# Patient Record
Sex: Female | Born: 1990 | Race: Black or African American | Hispanic: No | Marital: Single | Smoking: Never smoker
Health system: Southern US, Community
[De-identification: ages and names within clinical notes are randomized; demographics above are authoritative.]

## PROBLEM LIST (undated history)

## (undated) DIAGNOSIS — M549 Dorsalgia, unspecified: Secondary | ICD-10-CM

## (undated) DIAGNOSIS — F419 Anxiety disorder, unspecified: Secondary | ICD-10-CM

## (undated) DIAGNOSIS — D649 Anemia, unspecified: Secondary | ICD-10-CM

## (undated) DIAGNOSIS — F32A Depression, unspecified: Secondary | ICD-10-CM

## (undated) DIAGNOSIS — F329 Major depressive disorder, single episode, unspecified: Secondary | ICD-10-CM

## (undated) HISTORY — DX: Anxiety disorder, unspecified: F41.9

## (undated) HISTORY — PX: TONSILLECTOMY: SUR1361

## (undated) HISTORY — DX: Anemia, unspecified: D64.9

## (undated) HISTORY — DX: Depression, unspecified: F32.A

---
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
## 2015-11-07 ENCOUNTER — Encounter (HOSPITAL_COMMUNITY): Payer: Self-pay

## 2015-11-07 ENCOUNTER — Emergency Department (HOSPITAL_COMMUNITY)
Admission: EM | Admit: 2015-11-07 | Discharge: 2015-11-07 | Disposition: A | Discharge status: Home / Self Care | Payer: Self-pay | Attending: Emergency Medicine | Admitting: Emergency Medicine

## 2015-11-07 DIAGNOSIS — Z79891 Long term (current) use of opiate analgesic: Secondary | ICD-10-CM | POA: Insufficient documentation

## 2015-11-07 DIAGNOSIS — R102 Pelvic and perineal pain: Secondary | ICD-10-CM | POA: Insufficient documentation

## 2015-11-07 LAB — COMPREHENSIVE METABOLIC PANEL
ALT: 13 U/L — AB (ref 14–54)
AST: 17 U/L (ref 15–41)
Albumin: 3.7 g/dL (ref 3.5–5.0)
Alkaline Phosphatase: 70 U/L (ref 38–126)
Anion gap: 6 (ref 5–15)
BILIRUBIN TOTAL: 0.5 mg/dL (ref 0.3–1.2)
BUN: 15 mg/dL (ref 6–20)
CALCIUM: 9.6 mg/dL (ref 8.9–10.3)
CHLORIDE: 107 mmol/L (ref 101–111)
CO2: 23 mmol/L (ref 22–32)
CREATININE: 1.24 mg/dL — AB (ref 0.44–1.00)
GFR, EST NON AFRICAN AMERICAN: 60 mL/min — AB (ref >60)
Glucose, Bld: 98 mg/dL (ref 65–99)
Potassium: 3.6 mmol/L (ref 3.5–5.1)
Sodium: 136 mmol/L (ref 135–145)
TOTAL PROTEIN: 6.9 g/dL (ref 6.5–8.1)

## 2015-11-07 LAB — WET PREP, GENITAL
SPERM: NONE SEEN
TRICH WET PREP: NONE SEEN
Yeast Wet Prep HPF POC: NONE SEEN

## 2015-11-07 LAB — URINE MICROSCOPIC-ADD ON: WBC UA: NONE SEEN WBC/hpf (ref 0–5)

## 2015-11-07 LAB — URINALYSIS, ROUTINE W REFLEX MICROSCOPIC
Bilirubin Urine: NEGATIVE
Glucose, UA: NEGATIVE mg/dL
KETONES UR: NEGATIVE mg/dL
LEUKOCYTES UA: NEGATIVE
NITRITE: NEGATIVE
PROTEIN: NEGATIVE mg/dL
Specific Gravity, Urine: 1.023 (ref 1.005–1.030)
pH: 5.5 (ref 5.0–8.0)

## 2015-11-07 LAB — CBC
HCT: 40.8 % (ref 36.0–46.0)
Hemoglobin: 13 g/dL (ref 12.0–15.0)
MCH: 27.7 pg (ref 26.0–34.0)
MCHC: 31.9 g/dL (ref 30.0–36.0)
MCV: 86.8 fL (ref 78.0–100.0)
PLATELETS: 355 10*3/uL (ref 150–400)
RBC: 4.7 MIL/uL (ref 3.87–5.11)
RDW: 12.5 % (ref 11.5–15.5)
WBC: 7.4 10*3/uL (ref 4.0–10.5)

## 2015-11-07 LAB — LIPASE, BLOOD: LIPASE: 31 U/L (ref 11–51)

## 2015-11-07 LAB — PREGNANCY, URINE: PREG TEST UR: NEGATIVE

## 2015-11-07 MED ORDER — OXYCODONE-ACETAMINOPHEN 5-325 MG PO TABS: 1.0000 | ORAL_TABLET | Freq: Four times a day (QID) | ORAL | Status: DC | PRN

## 2015-11-07 MED ORDER — KETOROLAC TROMETHAMINE 60 MG/2ML IM SOLN
60.0000 mg | Freq: Once | INTRAMUSCULAR | Status: AC
  Administered 2015-11-07: 60 mg via INTRAMUSCULAR
  Filled 2015-11-07: qty 2

## 2015-11-07 NOTE — ED Notes (Signed)
Pt states she is experiencing particularly painful menstrual cramps. Has been on period x 2 weeks. Pt states pain throughout back and lower abdomen. Pt states recently had abscess lanced and placed on abx. Since abx, cramps increased.

## 2015-11-07 NOTE — ED Notes (Signed)
Patient states she has been on her period for 2 weeks.  States her abd cramping got worse when she started the Bactrim for an abscess that has been lanced. States she is not just spotting no heavy bleeding at this time.

## 2015-11-07 NOTE — ED Provider Notes (Signed)
CSN: [id number]     Arrival date & time 11/07/15  0207 History   First MD Initiated Contact with Patient 11/07/15 0402     Chief Complaint  Patient presents with  . Abdominal Cramping     (Consider location/radiation/quality/duration/timing/severity/associated sxs/prior Treatment) Patient is a 25 y.o. female presenting with cramps. The history is provided by the patient.  Abdominal Cramping This is a recurrent problem. Episode onset: 2 weeks ago. The problem occurs constantly. The problem has not changed since onset.Associated symptoms include abdominal pain. Associated symptoms comments: Lower abdominal cramping that radiates to her lower back. Intermittent vaginal spotting over the last 2 weeks. No vaginal discharge. Last sexual contact was 4 months ago but not consistently using protection. No fevers but occasional vomiting related to pain. No diarrhea.. The symptoms are aggravated by walking (Standing). Nothing relieves the symptoms. Treatments tried: Ibuprofen and some leftover Vicodin. The treatment provided no relief.    History reviewed. No pertinent past medical history. History reviewed. No pertinent past surgical history. History reviewed. No pertinent family history. Social History  Substance Use Topics  . Smoking status: Never Smoker   . Smokeless tobacco: None  . Alcohol Use: Yes   OB History    No data available     Review of Systems  Gastrointestinal: Positive for abdominal pain.  All other systems reviewed and are negative.     Allergies  Review of patient's allergies indicates not on file.  Home Medications   Prior to Admission medications   Medication Sig Start Date End Date Taking? Authorizing Provider  oxyCODONE-acetaminophen (PERCOCET/ROXICET) 5-325 MG tablet Take 1-2 tablets by mouth every 6 (six) hours as needed for severe pain. 11/07/15   Gwyneth SproutWhitney Stuti Sandin, MD   BP 130/92 mmHg  Pulse 84  Temp(Src) 98.3 F (36.8 C) (Oral)  Resp 18  SpO2 100%   LMP 11/07/2015 Physical Exam  Constitutional: She is oriented to person, place, and time. She appears well-developed and well-nourished. No distress.  HENT:  Head: Normocephalic and atraumatic.  Mouth/Throat: Oropharynx is clear and moist.  Eyes: Conjunctivae and EOM are normal. Pupils are equal, round, and reactive to light.  Neck: Normal range of motion. Neck supple.  Cardiovascular: Normal rate, regular rhythm and intact distal pulses.   No murmur heard. Pulmonary/Chest: Effort normal and breath sounds normal. No respiratory distress. She has no wheezes. She has no rales.  Abdominal: Soft. She exhibits no distension. There is no tenderness. There is no rebound, no guarding and no CVA tenderness.  Musculoskeletal: Normal range of motion. She exhibits no edema.       Lumbar back: She exhibits tenderness and pain. She exhibits no bony tenderness.       Back:  Neurological: She is alert and oriented to person, place, and time.  Skin: Skin is warm and dry. No rash noted. No erythema.  Psychiatric: She has a normal mood and affect. Her behavior is normal.  Nursing note and vitals reviewed.   ED Course  Procedures (including critical care time) Labs Review Labs Reviewed  WET PREP, GENITAL - Abnormal; Notable for the following:    Clue Cells Wet Prep HPF POC PRESENT (*)    WBC, Wet Prep HPF POC FEW (*)    All other components within normal limits  COMPREHENSIVE METABOLIC PANEL - Abnormal; Notable for the following:    Creatinine, Ser 1.24 (*)    ALT 13 (*)    GFR calc non Af Amer 60 (*)    All other  components within normal limits  URINALYSIS, ROUTINE W REFLEX MICROSCOPIC (NOT AT [facility]) - Abnormal; Notable for the following:    Hgb urine dipstick LARGE (*)    All other components within normal limits  URINE MICROSCOPIC-ADD ON - Abnormal; Notable for the following:    Squamous Epithelial / LPF 0-5 (*)    Bacteria, UA RARE (*)    All other components within normal limits  LIPASE,  BLOOD  CBC  PREGNANCY, URINE  GC/CHLAMYDIA PROBE AMP ([place]) NOT AT [facility]    Imaging Review No results found. I have personally reviewed and evaluated these images and lab results as part of my medical decision-making.   EKG Interpretation None      MDM   Final diagnoses:  Pelvic pain in female    Patient is a 25 year old female presenting today with pelvic pain that's been going on for 2 weeks which she describes as severe abdominal cramping with only minimal vaginal bleeding. Patient has a long history of metromenorrhagia and has been on birth control from a young age. She currently has Nexplanon which is due to be changed in November. She states that she has been taking ibuprofen as well as some leftover Vicodin which is most likely outdated without improvement in her pain. She denies any urinary symptoms but pain does radiate to her lower back. She has no new vaginal discharge and last sexual contact was 4 months ago. She will have occasional vomiting but denies any fevers.  On pelvic exam patient has mild tenderness over bilateral adnexa but no cervical motion tenderness or uterine tenderness. There is some blood in the vaginal vault but no significant discharge. Palpable lumbar tenderness but no suprapubic tenderness.  Pelvic cultures done however low suspicion for STD at this time.  Wet prep with clue cells but no other significant findings. UA with hemoglobin but otherwise normal. CBC, CMP and lipase all without acute findings. Low suspicion the patient's symptoms are caused by kidney stone, UTI, appendicitis. Feel most likely that this is pelvic in nature. She was given pain medication and referred to gynecology.   Gwyneth Sprout, MD 11/07/15 [phone]

## 2015-11-07 NOTE — ED Notes (Signed)
Discharge instructions reviewed - voiced understanding 

## 2015-11-07 NOTE — ED Notes (Signed)
Pelvic cart set up in room 

## 2015-11-09 LAB — GC/CHLAMYDIA PROBE AMP (~~LOC~~) NOT AT ARMC
Chlamydia: NEGATIVE
NEISSERIA GONORRHEA: NEGATIVE

## 2016-07-16 ENCOUNTER — Encounter (HOSPITAL_COMMUNITY): Payer: Self-pay | Admitting: Emergency Medicine

## 2016-07-16 ENCOUNTER — Emergency Department (HOSPITAL_COMMUNITY)
Admission: EM | Admit: 2016-07-16 | Discharge: 2016-07-16 | Disposition: A | Discharge status: Home / Self Care | Payer: Self-pay | Attending: Emergency Medicine | Admitting: Emergency Medicine

## 2016-07-16 DIAGNOSIS — M545 Low back pain: Secondary | ICD-10-CM | POA: Insufficient documentation

## 2016-07-16 DIAGNOSIS — G8929 Other chronic pain: Secondary | ICD-10-CM | POA: Insufficient documentation

## 2016-07-16 DIAGNOSIS — Y999 Unspecified external cause status: Secondary | ICD-10-CM | POA: Insufficient documentation

## 2016-07-16 DIAGNOSIS — X500XXA Overexertion from strenuous movement or load, initial encounter: Secondary | ICD-10-CM | POA: Insufficient documentation

## 2016-07-16 DIAGNOSIS — Y939 Activity, unspecified: Secondary | ICD-10-CM | POA: Insufficient documentation

## 2016-07-16 DIAGNOSIS — Y92410 Unspecified street and highway as the place of occurrence of the external cause: Secondary | ICD-10-CM | POA: Insufficient documentation

## 2016-07-16 HISTORY — DX: Dorsalgia, unspecified: M54.9

## 2016-07-16 LAB — URINALYSIS, ROUTINE W REFLEX MICROSCOPIC
Bilirubin Urine: NEGATIVE
GLUCOSE, UA: NEGATIVE mg/dL
Hgb urine dipstick: NEGATIVE
Ketones, ur: NEGATIVE mg/dL
LEUKOCYTES UA: NEGATIVE
NITRITE: NEGATIVE
Protein, ur: NEGATIVE mg/dL
Specific Gravity, Urine: 1.025 (ref 1.005–1.030)
pH: 5 (ref 5.0–8.0)

## 2016-07-16 MED ORDER — METHOCARBAMOL 500 MG PO TABS
1000.0000 mg | ORAL_TABLET | Freq: Once | ORAL | Status: AC
  Administered 2016-07-16: 1000 mg via ORAL
  Filled 2016-07-16: qty 2

## 2016-07-16 MED ORDER — CYCLOBENZAPRINE HCL 10 MG PO TABS: 10.0000 mg | ORAL_TABLET | Freq: Two times a day (BID) | ORAL | 0 refills | Status: DC | PRN

## 2016-07-16 MED ORDER — HYDROCODONE-ACETAMINOPHEN 5-325 MG PO TABS: 1.0000 | ORAL_TABLET | Freq: Four times a day (QID) | ORAL | 0 refills | Status: DC | PRN

## 2016-07-16 MED ORDER — IBUPROFEN 200 MG PO TABS
600.0000 mg | ORAL_TABLET | Freq: Once | ORAL | Status: AC
  Administered 2016-07-16: 600 mg via ORAL
  Filled 2016-07-16: qty 1

## 2016-07-16 MED ORDER — HYDROCODONE-ACETAMINOPHEN 5-325 MG PO TABS
1.0000 | ORAL_TABLET | Freq: Once | ORAL | Status: AC
  Administered 2016-07-16: 1 via ORAL
  Filled 2016-07-16: qty 1

## 2016-07-16 MED ORDER — IBUPROFEN 600 MG PO TABS: 600.0000 mg | ORAL_TABLET | Freq: Four times a day (QID) | ORAL | 0 refills | Status: DC | PRN

## 2016-07-16 MED ORDER — ONDANSETRON 4 MG PO TBDP
4.0000 mg | ORAL_TABLET | Freq: Once | ORAL | Status: AC
  Administered 2016-07-16: 4 mg via ORAL
  Filled 2016-07-16: qty 1

## 2016-07-16 NOTE — ED Provider Notes (Signed)
[facility]-EMERGENCY DEPT Provider Note   CSN: [id number] Arrival date & time: 07/16/16  0108     History   Chief Complaint Chief Complaint  Patient presents with  . Back Pain    HPI Alyssa KatoQuanshe Bachmann is a 26 y.o. female who presents with back pain. PMH significant for chronic back pain. 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 She has had an extensive work up and previously was receiving trigger point injections which helped. 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 She is originally from [place] and moved here a year ago. She has been uninsured and therefore unable to follow up with a specialist since then. She states 4 days ago she started to notice gradual onset of worsening acute on chronic back pain. The pain is across her lower back and goes in to her bilateral buttocks and thighs down to the knee. Her pain is usually a 4-5 and today is 8-9. She denies No fever, syncope, trauma, unexplained weight loss, hx of cancer, loss of bowel/bladder function, saddle anesthesia, urinary retention, IVDU, dysuria.. She states she has had difficulty ambulating due to pain and has had some leg weakness if she stands for too long.   HPI  Past Medical History:  Diagnosis Date  . Back pain     There are no active problems to display for this patient.   History reviewed. No pertinent surgical history.  OB History    No data available       Home Medications    Prior to Admission medications   Medication Sig Start Date End Date Taking? Authorizing Provider  oxyCODONE-acetaminophen (PERCOCET/ROXICET) 5-325 MG tablet Take 1-2 tablets by mouth every 6 (six) hours as needed for severe pain. 11/07/15   Gwyneth SproutWhitney Plunkett, MD    Family History No family history on file.  Social History Social History  Substance Use Topics  . Smoking status:  Never Smoker  . Smokeless tobacco: Never Used  . Alcohol use Yes     Allergies   Patient has no allergy information on record.   Review of Systems Review of Systems  Constitutional: Negative for chills and fever.  Genitourinary: Negative for dysuria.  Musculoskeletal: Positive for arthralgias, back pain, gait problem and myalgias.  Neurological: Positive for weakness. Negative for numbness.     Physical Exam Updated Vital Signs BP 109/74   Pulse 90   Temp 98.6 F (37 C) (Oral)   Resp 19   Ht 5\' 6"  (1.676 m)   Wt 116.1 kg   SpO2 100%   BMI 41.32 kg/m   Physical Exam  Constitutional: She is oriented to person, place, and time. She appears well-developed and well-nourished. No distress.  HENT:  Head: Normocephalic and atraumatic.  Eyes: Conjunctivae are normal. Pupils are equal, round, and reactive to light. Right eye exhibits no discharge. Left eye exhibits no discharge. No scleral icterus.  Neck: Normal range of motion.  Cardiovascular: Normal rate.   Pulmonary/Chest: Effort normal. No respiratory distress.  Abdominal: She exhibits no distension.  Musculoskeletal:  Inspection: No masses, deformity, or rash Palpation: Diffuse low back tenderness and bilateral SI joint tenderness Strength: 4/5 in lower extremities bilaterally and normal plantar and dorsiflexion Sensation: Intact sensation with light touch in lower extremities bilaterally Reflexes: Patellar reflex is 2+ bilaterally SLR: Negative seated straight leg raise Gait:  Antalgic gait  Neurological: She is alert and oriented to person, place, and time.  Skin: Skin is warm and dry.  Psychiatric: She has a normal mood and affect. Her behavior is normal.  Nursing note and vitals reviewed.    ED Treatments / Results  Labs (all labs ordered are listed, but only abnormal results are displayed) Labs Reviewed  URINALYSIS, ROUTINE W REFLEX MICROSCOPIC - Abnormal; Notable for the following:       Result Value    APPearance HAZY (*)    All other components within normal limits  POC URINE PREG, ED    EKG  EKG Interpretation None       Radiology No results found.  Procedures Procedures (including critical care time)  Medications Ordered in ED Medications - No data to display   Initial Impression / Assessment and Plan / ED Course  I have reviewed the triage vital signs and the nursing notes.  Pertinent labs & imaging results that were available during my care of the patient were reviewed by me and considered in my medical decision making (see chart for details).  26 year old female presents with acute on chronic back pain. She has diffuse pain and is ambulatory with 2+ reflexes. No red flags in history. Pain treated in ED and rx for meds given as well. Patient states pain has gone from 9 to 6. Advised follow up with PCP. Return precautions given.  Final Clinical Impressions(s) / ED Diagnoses   Final diagnoses:  Chronic bilateral low back pain, with sciatica presence unspecified    New Prescriptions Discharge Medication List as of 07/16/2016  6:47 AM    START taking these medications   Details  cyclobenzaprine (FLEXERIL) 10 MG tablet Take 1 tablet (10 mg total) by mouth 2 (two) times daily as needed for muscle spasms., Starting Sat 07/16/2016, Print    HYDROcodone-acetaminophen (NORCO/VICODIN) 5-325 MG tablet Take 1 tablet by mouth every 6 (six) hours as needed., Starting Sat 07/16/2016, Print    ibuprofen (ADVIL,MOTRIN) 600 MG tablet Take 1 tablet (600 mg total) by mouth every 6 (six) hours as needed., Starting Sat 07/16/2016, Print         Bethel Born, PA-C 07/16/16 9629    Glynn Octave, MD 07/16/16 [phone]

## 2016-07-16 NOTE — ED Triage Notes (Addendum)
C/o flare-up of lower back pain and bilateral hip pain since working on Tuesday.  Reports history of chronic back pain since 2014.  Denies urinary complaints.

## 2016-07-16 NOTE — Discharge Instructions (Signed)
Take Ibuprofen for the next week. Take this medicine with food. Take muscle relaxer at bedtime to help you sleep. This medicine makes you drowsy so do not take before driving or work Take pain medicine when pain is severe Use a heating pad for sore muscles - use for 20 minutes several times a day

## 2016-07-16 NOTE — ED Notes (Signed)
Pt understood dc material. NAD noted. Scripts given at dc 

## 2017-03-09 ENCOUNTER — Emergency Department (HOSPITAL_COMMUNITY)
Admission: EM | Admit: 2017-03-09 | Discharge: 2017-03-09 | Disposition: A | Discharge status: Home / Self Care | Payer: No Typology Code available for payment source | Attending: Emergency Medicine | Admitting: Emergency Medicine

## 2017-03-09 ENCOUNTER — Encounter (HOSPITAL_COMMUNITY): Payer: Self-pay | Admitting: *Deleted

## 2017-03-09 DIAGNOSIS — N946 Dysmenorrhea, unspecified: Secondary | ICD-10-CM

## 2017-03-09 DIAGNOSIS — R1030 Lower abdominal pain, unspecified: Secondary | ICD-10-CM | POA: Diagnosis present

## 2017-03-09 LAB — WET PREP, GENITAL
Clue Cells Wet Prep HPF POC: NONE SEEN
Sperm: NONE SEEN
Trich, Wet Prep: NONE SEEN
Yeast Wet Prep HPF POC: NONE SEEN

## 2017-03-09 LAB — URINALYSIS, ROUTINE W REFLEX MICROSCOPIC
Bilirubin Urine: NEGATIVE
GLUCOSE, UA: NEGATIVE mg/dL
HGB URINE DIPSTICK: NEGATIVE
KETONES UR: NEGATIVE mg/dL
Leukocytes, UA: NEGATIVE
Nitrite: NEGATIVE
PH: 6 (ref 5.0–8.0)
PROTEIN: NEGATIVE mg/dL
Specific Gravity, Urine: 1.025 (ref 1.005–1.030)

## 2017-03-09 LAB — POC URINE PREG, ED: Preg Test, Ur: NEGATIVE

## 2017-03-09 MED ORDER — KETOROLAC TROMETHAMINE 60 MG/2ML IM SOLN
30.0000 mg | Freq: Once | INTRAMUSCULAR | Status: AC
  Administered 2017-03-09: 30 mg via INTRAMUSCULAR
  Filled 2017-03-09: qty 2

## 2017-03-09 NOTE — ED Triage Notes (Signed)
Pt states she has been miserable with menstrual starting since Friday.  Pt states she is having the worst back pain, nausea, headaches, and states she has not been to work all week.  Pt is on implant to prevent menstrual.

## 2017-03-09 NOTE — ED Notes (Signed)
Pt verbalized understanding discharge instructions and denies any further needs or questions at this time. VS stable, ambulatory and steady gait.   

## 2017-03-09 NOTE — ED Provider Notes (Signed)
[facility]PT Provider Note   CSN: [id number] Arrival date & time: 03/09/17  4098     History   Chief Complaint Chief Complaint  Patient presents with  . Abdominal Cramping    HPI Alyssa Forbes is a 26 y.o. female.  The history is provided by the patient and medical records. No language interpreter was used.  Abdominal Cramping    Alyssa Forbes is a 26 y.o. female  with a PMH of dysmenorrhia who presents to the Emergency Department complaining of persistent bilateral lower abdominal cramping  4 days (during menstrual cycle which began on 10/05). Patient states that she has very painful menstrual cycles with each period. Today symptoms feel similar to her typical menstrual cycles, however a little more severe than usual. Associated symptoms include low back pain and nausea. No vomiting. She's been taking Pamprin and ibuprofen with mild improvement. No fever, chills, diarrhea, constipation, vaginal discharge, dysuria, chest pain or shortness of breath.  Past Medical History:  Diagnosis Date  . Back pain     There are no active problems to display for this patient.   Past Surgical History:  Procedure Laterality Date  . TONSILLECTOMY      OB History    No data available       Home Medications    Prior to Admission medications   Medication Sig Start Date End Date Taking? Authorizing Provider  cyclobenzaprine (FLEXERIL) 10 MG tablet Take 1 tablet (10 mg total) by mouth 2 (two) times daily as needed for muscle spasms. 07/16/16   Bethel Born, PA-C  HYDROcodone-acetaminophen (NORCO/VICODIN) 5-325 MG tablet Take 1 tablet by mouth every 6 (six) hours as needed. 07/16/16   Bethel Born, PA-C  ibuprofen (ADVIL,MOTRIN) 600 MG tablet Take 1 tablet (600 mg total) by mouth every 6 (six) hours as needed. 07/16/16   Bethel Born, PA-C  oxyCODONE-acetaminophen (PERCOCET/ROXICET) 5-325 MG tablet Take 1-2 tablets by mouth every 6 (six) hours as needed for severe  pain. 11/07/15   Gwyneth Sprout, MD    Family History No family history on file.  Social History Social History  Substance Use Topics  . Smoking status: Never Smoker  . Smokeless tobacco: Never Used  . Alcohol use Yes     Allergies   Nasonex [mometasone] and Rhinocort [budesonide]   Review of Systems Review of Systems  Genitourinary: Positive for menstrual problem, pelvic pain, urgency and vaginal bleeding.  All other systems reviewed and are negative.    Physical Exam Updated Vital Signs BP 103/83   Pulse 75   Temp 99 F (37.2 C) (Oral)   Resp 16   LMP 03/03/2017   SpO2 100%   Physical Exam  Constitutional: She is oriented to person, place, and time. She appears well-developed and well-nourished. No distress.  Well appearing.  HENT:  Head: Normocephalic and atraumatic.  Cardiovascular: Normal rate, regular rhythm and normal heart sounds.   No murmur heard. Pulmonary/Chest: Effort normal and breath sounds normal. No respiratory distress.  Abdominal: Soft. She exhibits no distension.  Mild bilateral lower abdominal tenderness without rebound or guarding. No CVA tenderness.  Genitourinary:  Genitourinary Comments: Chaperone present for exam. No discharge. No CMT. No No adnexal masses, tenderness, or fullness.  + menstrual bleeding within vaginal vault.  Neurological: She is alert and oriented to person, place, and time.  Skin: Skin is warm and dry.  Nursing note and vitals reviewed.    ED Treatments / Results  Labs (all labs ordered are listed,  but only abnormal results are displayed) Labs Reviewed  WET PREP, GENITAL - Abnormal; Notable for the following:       Result Value   WBC, Wet Prep HPF POC MANY (*)    All other components within normal limits  URINALYSIS, ROUTINE W REFLEX MICROSCOPIC  POC URINE PREG, ED  GC/CHLAMYDIA PROBE AMP ([place]) NOT AT [facility]    EKG  EKG Interpretation None       Radiology No results  found.  Procedures Procedures (including critical care time)  Medications Ordered in ED Medications  ketorolac (TORADOL) injection 30 mg (30 mg Intramuscular Given 03/09/17 1030)     Initial Impression / Assessment and Plan / ED Course  I have reviewed the triage vital signs and the nursing notes.  Pertinent labs & imaging results that were available during my care of the patient were reviewed by me and considered in my medical decision making (see chart for details).    Alyssa Forbes is a 26 y.o. female who presents to ED for bilateral lower abdominal cramping during menstrual cycle. Hx of dysmenorrhea with similar symptoms in several previous menses. Pain not improved with OTC medications. Patient notes that she has come to ED for same in the past where she was given Toradol which helped. Toradol given in ED today for symptomatic management. On exam, patient is afebrile, hemodynamically stable with a non-surgical abdomen. No CVA tenderness. Pelvic with no cervical motion or adnexal tenderness. Upreg negative. UA negative. Wet prep with any WBCs, but otherwise unremarkable. Patient with no vaginal discharge and reassuring pelvic exam. Discussed prophylactic G&C  treatment, however patient would like to quit cultures and I was concerned for STDs. Will have patient follow up with OB/GYN. Reasons to return to ED discussed and all questions answered.  Final Clinical Impressions(s) / ED Diagnoses   Final diagnoses:  Dysmenorrhea    New Prescriptions New Prescriptions   No medications on file     Ward, Chase Picket, PA-C 03/09/17 1121    Pricilla Loveless, MD 03/10/17 [phone]

## 2017-03-09 NOTE — Discharge Instructions (Signed)
It was my pleasure taking care of you today!   Continue alternating between ibuprofen and pamprin as needed for pain.   Follow up with the women's clinic in regards to today's visit. Please call today to schedule a follow up appointment. Please return to the ER for new or worsening symptoms, any additional concerns.   You have been tested for chlamydia and gonorrhea. These results will be available in approximately 3 days. You will be notified if they are positive.

## 2017-03-10 LAB — GC/CHLAMYDIA PROBE AMP (~~LOC~~) NOT AT ARMC
CHLAMYDIA, DNA PROBE: NEGATIVE
NEISSERIA GONORRHEA: NEGATIVE

## 2017-08-16 DIAGNOSIS — Z6841 Body Mass Index (BMI) 40.0 and over, adult: Secondary | ICD-10-CM | POA: Insufficient documentation

## 2018-06-05 ENCOUNTER — Emergency Department (HOSPITAL_BASED_OUTPATIENT_CLINIC_OR_DEPARTMENT_OTHER)
Admission: EM | Admit: 2018-06-05 | Discharge: 2018-06-05 | Disposition: A | Discharge status: Home / Self Care | Payer: No Typology Code available for payment source | Attending: Emergency Medicine | Admitting: Emergency Medicine

## 2018-06-05 ENCOUNTER — Emergency Department (HOSPITAL_BASED_OUTPATIENT_CLINIC_OR_DEPARTMENT_OTHER): Payer: No Typology Code available for payment source

## 2018-06-05 ENCOUNTER — Encounter (HOSPITAL_BASED_OUTPATIENT_CLINIC_OR_DEPARTMENT_OTHER): Payer: Self-pay | Admitting: *Deleted

## 2018-06-05 ENCOUNTER — Other Ambulatory Visit: Payer: Self-pay

## 2018-06-05 DIAGNOSIS — Y939 Activity, unspecified: Secondary | ICD-10-CM | POA: Insufficient documentation

## 2018-06-05 DIAGNOSIS — Y929 Unspecified place or not applicable: Secondary | ICD-10-CM | POA: Insufficient documentation

## 2018-06-05 DIAGNOSIS — X58XXXA Exposure to other specified factors, initial encounter: Secondary | ICD-10-CM | POA: Insufficient documentation

## 2018-06-05 DIAGNOSIS — Y999 Unspecified external cause status: Secondary | ICD-10-CM | POA: Insufficient documentation

## 2018-06-05 DIAGNOSIS — S8992XA Unspecified injury of left lower leg, initial encounter: Secondary | ICD-10-CM | POA: Insufficient documentation

## 2018-06-05 DIAGNOSIS — Z79899 Other long term (current) drug therapy: Secondary | ICD-10-CM | POA: Insufficient documentation

## 2018-06-05 DIAGNOSIS — S8392XA Sprain of unspecified site of left knee, initial encounter: Secondary | ICD-10-CM

## 2018-06-05 NOTE — ED Triage Notes (Addendum)
Pt co left knee pain x 5 days ago w/o injury , no relief with brace, OTC meds and ice

## 2018-06-05 NOTE — ED Provider Notes (Signed)
[facility] EMERGENCY DEPARTMENT Provider Note   CSN: [id number] Arrival date & time: 06/05/18  2252     History   Chief Complaint Chief Complaint  Patient presents with  . Knee Pain    HPI Jamie KatoQuanshe Duclos is a 28 y.o. female.  Patient is a 28 year old female with no significant past medical history.  She presents with complaints of left knee pain.  She states this past Friday while ambulating at work she turned and felt a pop in her knee.  She has felt popping and slipping in her knee since.  She reports pain and mild swelling.  The history is provided by the patient.  Knee Pain  Location:  Knee Knee location:  L knee Pain details:    Quality:  Aching   Radiates to:  Does not radiate   Severity:  Moderate   Onset quality:  Sudden   Duration:  4 days   Timing:  Constant   Progression:  Unchanged Chronicity:  New Relieved by:  Nothing Worsened by:  Bearing weight Ineffective treatments:  None tried   Past Medical History:  Diagnosis Date  . Back pain     There are no active problems to display for this patient.   Past Surgical History:  Procedure Laterality Date  . TONSILLECTOMY       OB History   No obstetric history on file.      Home Medications    Prior to Admission medications   Medication Sig Start Date End Date Taking? Authorizing Provider  acetaminophen (TYLENOL) 500 MG tablet Take 500 mg by mouth every 6 (six) hours as needed.   Yes [provider]  ibuprofen (ADVIL,MOTRIN) 600 MG tablet Take 1 tablet (600 mg total) by mouth every 6 (six) hours as needed. 07/16/16   Bethel BornGekas, Kelly Marie, PA-C    Family History History reviewed. No pertinent family history.  Social History Social History   Tobacco Use  . Smoking status: Never Smoker  . Smokeless tobacco: Never Used  Substance Use Topics  . Alcohol use: Yes  . Drug use: No     Allergies   Nasonex [mometasone] and Rhinocort [budesonide]   Review of Systems Review  of Systems  All other systems reviewed and are negative.    Physical Exam Updated Vital Signs BP 133/88 (BP Location: Right Arm)   Pulse 95   Temp (!) 97.3 F (36.3 C) (Oral)   Resp 18   Ht 5\' 6"  (1.676 m)   Wt 118.8 kg   LMP 05/22/2018   SpO2 100%   BMI 42.29 kg/m   Physical Exam Vitals signs and nursing note reviewed.  Constitutional:      Appearance: Normal appearance.  HENT:     Head: Normocephalic and atraumatic.  Neck:     Musculoskeletal: Normal range of motion and neck supple.  Pulmonary:     Effort: Pulmonary effort is normal.  Musculoskeletal:     Comments: The left knee appears grossly normal.  There is no palpable effusion.  She has pain with extension and there is some crepitus palpable.  Anterior and posterior drawer tests are negative and there is no laxity with varus or valgus stress.  Skin:    General: Skin is warm and dry.  Neurological:     Mental Status: She is alert.      ED Treatments / Results  Labs (all labs ordered are listed, but only abnormal results are displayed) Labs Reviewed - No data to display  EKG None  Radiology No results found.  Procedures Procedures (including critical care time)  Medications Ordered in ED Medications - No data to display   Initial Impression / Assessment and Plan / ED Course  I have reviewed the triage vital signs and the nursing notes.  Pertinent labs & imaging results that were available during my care of the patient were reviewed by me and considered in my medical decision making (see chart for details).  Patient appears to have a left knee sprain.  I appreciate no ligamentous instability on exam.  Patient will be advised to continue anti-inflammatories, wearing her knee brace, and following up with primary doctor next week if not improving to discuss physical therapy or additional imaging studies.  Final Clinical Impressions(s) / ED Diagnoses   Final diagnoses:  None    ED Discharge  Orders    None       Geoffery Lyonselo, Kyndra Condron, MD 06/05/18 2349

## 2018-06-05 NOTE — Discharge Instructions (Signed)
Continue to wear your knee brace as before.  Ibuprofen 600 mg every 6 hours as needed for pain.  Follow-up with your primary doctor next week if not improving to discuss physical therapy or possibly further imaging.

## 2018-06-09 ENCOUNTER — Other Ambulatory Visit: Payer: Self-pay

## 2018-06-09 ENCOUNTER — Encounter (HOSPITAL_BASED_OUTPATIENT_CLINIC_OR_DEPARTMENT_OTHER): Payer: Self-pay | Admitting: Emergency Medicine

## 2018-06-09 ENCOUNTER — Emergency Department (HOSPITAL_BASED_OUTPATIENT_CLINIC_OR_DEPARTMENT_OTHER)
Admission: EM | Admit: 2018-06-09 | Discharge: 2018-06-09 | Disposition: A | Discharge status: Home / Self Care | Payer: No Typology Code available for payment source | Attending: Emergency Medicine | Admitting: Emergency Medicine

## 2018-06-09 DIAGNOSIS — Y929 Unspecified place or not applicable: Secondary | ICD-10-CM | POA: Insufficient documentation

## 2018-06-09 DIAGNOSIS — Y939 Activity, unspecified: Secondary | ICD-10-CM | POA: Insufficient documentation

## 2018-06-09 DIAGNOSIS — Y999 Unspecified external cause status: Secondary | ICD-10-CM | POA: Insufficient documentation

## 2018-06-09 DIAGNOSIS — X509XXA Other and unspecified overexertion or strenuous movements or postures, initial encounter: Secondary | ICD-10-CM | POA: Insufficient documentation

## 2018-06-09 DIAGNOSIS — S8392XA Sprain of unspecified site of left knee, initial encounter: Secondary | ICD-10-CM | POA: Insufficient documentation

## 2018-06-09 MED ORDER — KETOROLAC TROMETHAMINE 60 MG/2ML IM SOLN
30.0000 mg | Freq: Once | INTRAMUSCULAR | Status: AC
  Administered 2018-06-09: 30 mg via INTRAMUSCULAR
  Filled 2018-06-09: qty 2

## 2018-06-09 MED ORDER — LIDOCAINE 5 % EX PTCH: 1.0000 | MEDICATED_PATCH | CUTANEOUS | 0 refills | Status: DC

## 2018-06-09 NOTE — ED Provider Notes (Signed)
[facility] EMERGENCY DEPARTMENT Provider Note   CSN: [id number] Arrival date & time: 06/09/18  1014     History   Chief Complaint Chief Complaint  Patient presents with  . Knee Pain    HPI Alyssa Forbes is a 28 y.o. female.  HPI  28 year old female presents with continued left knee pain.  She is wondering if she can get a shot in her knee.  She states she injured it about a week ago when she turned while walking and thinks she sprained it.  Pain was still significant so she came in here on 1/7 and had an x-ray which was negative.  Pain has continued.  It was more swollen the day she arrived but the swelling is not gone but also improved.  Significant pain with walking.  No weakness in the leg or significant numbness.  No erythema or fevers.  She is taking Aleve and ibuprofen as well as Tylenol.  She has been wearing a knee sleeve.  Past Medical History:  Diagnosis Date  . Back pain     There are no active problems to display for this patient.   Past Surgical History:  Procedure Laterality Date  . TONSILLECTOMY       OB History   No obstetric history on file.      Home Medications    Prior to Admission medications   Medication Sig Start Date End Date Taking? Authorizing Provider  acetaminophen (TYLENOL) 500 MG tablet Take 500 mg by mouth every 6 (six) hours as needed.    [provider]  ibuprofen (ADVIL,MOTRIN) 600 MG tablet Take 1 tablet (600 mg total) by mouth every 6 (six) hours as needed. 07/16/16   Bethel BornGekas, Kelly Marie, PA-C  lidocaine (LIDODERM) 5 % Place 1 patch onto the skin daily. Remove & Discard patch within 12 hours or as directed by MD 06/09/18   Pricilla LovelessGoldston, Kou Gucciardo, MD    Family History History reviewed. No pertinent family history.  Social History Social History   Tobacco Use  . Smoking status: Never Smoker  . Smokeless tobacco: Never Used  Substance Use Topics  . Alcohol use: Yes  . Drug use: No     Allergies   Nasonex  [mometasone] and Rhinocort [budesonide]   Review of Systems Review of Systems  Constitutional: Negative for fever.  Musculoskeletal: Positive for arthralgias and joint swelling.  Skin: Negative for color change.     Physical Exam Updated Vital Signs BP (!) 143/95 (BP Location: Right Arm)   Pulse 96   Temp 98.1 F (36.7 C) (Oral)   Resp 16   Ht 5\' 6"  (1.676 m)   Wt 117.9 kg   LMP 05/16/2018 (Exact Date)   SpO2 99%   BMI 41.97 kg/m   Physical Exam Vitals signs and nursing note reviewed.  Constitutional:      Appearance: She is well-developed.  HENT:     Head: Normocephalic and atraumatic.     Right Ear: External ear normal.     Left Ear: External ear normal.     Nose: Nose normal.  Eyes:     General:        Right eye: No discharge.        Left eye: No discharge.  Cardiovascular:     Rate and Rhythm: Normal rate and regular rhythm.     Pulses:          Dorsalis pedis pulses are 2+ on the right side and 2+ on the left  side.  Pulmonary:     Effort: Pulmonary effort is normal.  Musculoskeletal:     Left knee: She exhibits swelling (mild). She exhibits normal range of motion, no effusion, no deformity and no erythema. Tenderness (over patella) found.  Skin:    General: Skin is warm and dry.     Findings: No erythema.  Neurological:     Mental Status: She is alert.  Psychiatric:        Mood and Affect: Mood is not anxious.      ED Treatments / Results  Labs (all labs ordered are listed, but only abnormal results are displayed) Labs Reviewed - No data to display  EKG None  Radiology No results found.  Procedures Procedures (including critical care time)  Medications Ordered in ED Medications  ketorolac (TORADOL) injection 30 mg (has no administration in time range)     Initial Impression / Assessment and Plan / ED Course  I have reviewed the triage vital signs and the nursing notes.  Pertinent labs & imaging results that were available during my  care of the patient were reviewed by me and considered in my medical decision making (see chart for details).     Patient continues to have pain with some mild swelling.  There was no effusion seen on the first x-ray given she states the swelling is better today and I do not feel an obvious effusion I do not think arthrocentesis would be beneficial.  Also discussed I am not sure the risks of injecting the knee would be worth some relief from possible steroids.  Offered repeat Xray but she declines. Think is more reasonable to try knee immobilizer and crutches given the continued pain as well as orthopedic referral given concern for knee sprain there is lasting a little longer than typical.  Very low suspicion for infectious joint.  I will also prescribe some topical lidocaine.  I have counseled her to take one or the other NSAID but not both.  Discussed return precautions.  Final Clinical Impressions(s) / ED Diagnoses   Final diagnoses:  Sprain of left knee, unspecified ligament, initial encounter    ED Discharge Orders         Ordered    lidocaine (LIDODERM) 5 %  Every 24 hours     06/09/18 1107           Pricilla Loveless, MD 06/09/18 1112

## 2018-06-09 NOTE — Discharge Instructions (Addendum)
If you develop severe worsening swelling of the knee, fever, redness of the knee, or severe pain, or any other new/concerning symptoms then return to the ER for evaluation.

## 2018-06-09 NOTE — ED Triage Notes (Signed)
Reports left knee pain.  Seen previously for same.  States she believes there is fluid in her knee that needs to be drawn off.

## 2018-06-09 NOTE — ED Notes (Signed)
Pt enrolled in aromatherapy pain trial 

## 2019-03-28 ENCOUNTER — Other Ambulatory Visit: Payer: Self-pay

## 2019-03-29 ENCOUNTER — Encounter: Payer: Self-pay | Admitting: Nurse Practitioner

## 2019-03-29 ENCOUNTER — Ambulatory Visit: Payer: Self-pay | Admitting: Nurse Practitioner

## 2019-03-29 VITALS — BP 122/90 | HR 94 | Temp 98.4°F | Ht 66.0 in | Wt 278.4 lb

## 2019-03-29 DIAGNOSIS — F5102 Adjustment insomnia: Secondary | ICD-10-CM

## 2019-03-29 DIAGNOSIS — E559 Vitamin D deficiency, unspecified: Secondary | ICD-10-CM

## 2019-03-29 DIAGNOSIS — F4323 Adjustment disorder with mixed anxiety and depressed mood: Secondary | ICD-10-CM

## 2019-03-29 DIAGNOSIS — K5904 Chronic idiopathic constipation: Secondary | ICD-10-CM

## 2019-03-29 LAB — COMPREHENSIVE METABOLIC PANEL
ALT: 12 U/L (ref 0–35)
AST: 16 U/L (ref 0–37)
Albumin: 3.9 g/dL (ref 3.5–5.2)
Alkaline Phosphatase: 63 U/L (ref 39–117)
BUN: 15 mg/dL (ref 6–23)
CO2: 26 mEq/L (ref 19–32)
Calcium: 9.4 mg/dL (ref 8.4–10.5)
Chloride: 107 mEq/L (ref 96–112)
Creatinine, Ser: 1.16 mg/dL (ref 0.40–1.20)
GFR: 67 mL/min (ref >60.00)
Glucose, Bld: 88 mg/dL (ref 70–99)
Potassium: 4.2 mEq/L (ref 3.5–5.1)
Sodium: 140 mEq/L (ref 135–145)
Total Bilirubin: <0.1 mg/dL — ABNORMAL LOW (ref 0.2–1.2)
Total Protein: 6.7 g/dL (ref 6.0–8.3)

## 2019-03-29 LAB — CBC
HCT: 39.6 % (ref 36.0–46.0)
Hemoglobin: 12.6 g/dL (ref 12.0–15.0)
MCHC: 32 g/dL (ref 30.0–36.0)
MCV: 88.1 fl (ref 78.0–100.0)
Platelets: 381 10*3/uL (ref 150.0–400.0)
RBC: 4.49 Mil/uL (ref 3.87–5.11)
RDW: 13.5 % (ref 11.5–15.5)
WBC: 6.4 10*3/uL (ref 4.0–10.5)

## 2019-03-29 LAB — TSH: TSH: 4.11 u[IU]/mL (ref 0.35–4.50)

## 2019-03-29 LAB — VITAMIN D 25 HYDROXY (VIT D DEFICIENCY, FRACTURES): VITD: 8.35 ng/mL — ABNORMAL LOW (ref 30.00–100.00)

## 2019-03-29 MED ORDER — AMITRIPTYLINE HCL 25 MG PO TABS: 25.0000 mg | ORAL_TABLET | Freq: Every day | ORAL | 1 refills | Status: DC

## 2019-03-29 MED ORDER — SENNOSIDES-DOCUSATE SODIUM 8.6-50 MG PO TABS: 1.0000 | ORAL_TABLET | Freq: Every day | ORAL | 0 refills | Status: DC

## 2019-03-29 NOTE — Patient Instructions (Addendum)
Very low vitamin D. Vitamin D 50,000IU weekly sent. Need to repeat lab in 31months. Normal CMP, TSH, and CBC F/up in 2weeks as discussed  Living With Anxiety  After being diagnosed with an anxiety disorder, you may be relieved to know why you have felt or behaved a certain way. It is natural to also feel overwhelmed about the treatment ahead and what it will mean for your life. With care and support, you can manage this condition and recover from it. How to cope with anxiety Dealing with stress Stress is your body's reaction to life changes and events, both good and bad. Stress can last just a few hours or it can be ongoing. Stress can play a major role in anxiety, so it is important to learn both how to cope with stress and how to think about it differently. Talk with your health care provider or a counselor to learn more about stress reduction. He or she may suggest some stress reduction techniques, such as:  Music therapy. This can include creating or listening to music that you enjoy and that inspires you.  Mindfulness-based meditation. This involves being aware of your normal breaths, rather than trying to control your breathing. It can be done while sitting or walking.  Centering prayer. This is a kind of meditation that involves focusing on a word, phrase, or sacred image that is meaningful to you and that brings you peace.  Deep breathing. To do this, expand your stomach and inhale slowly through your nose. Hold your breath for 3-5 seconds. Then exhale slowly, allowing your stomach muscles to relax.  Self-talk. This is a skill where you identify thought patterns that lead to anxiety reactions and correct those thoughts.  Muscle relaxation. This involves tensing muscles then relaxing them. Choose a stress reduction technique that fits your lifestyle and personality. Stress reduction techniques take time and practice. Set aside 5-15 minutes a day to do them. Therapists can offer training  in these techniques. The training may be covered by some insurance plans. Other things you can do to manage stress include:  Keeping a stress diary. This can help you learn what triggers your stress and ways to control your response.  Thinking about how you respond to certain situations. You may not be able to control everything, but you can control your reaction.  Making time for activities that help you relax, and not feeling guilty about spending your time in this way. Therapy combined with coping and stress-reduction skills provides the best chance for successful treatment. Medicines Medicines can help ease symptoms. Medicines for anxiety include:  Anti-anxiety drugs.  Antidepressants.  Beta-blockers. Medicines may be used as the main treatment for anxiety disorder, along with therapy, or if other treatments are not working. Medicines should be prescribed by a health care provider. Relationships Relationships can play a big part in helping you recover. Try to spend more time connecting with trusted friends and family members. Consider going to couples counseling, taking family education classes, or going to family therapy. Therapy can help you and others better understand the condition. How to recognize changes in your condition Everyone has a different response to treatment for anxiety. Recovery from anxiety happens when symptoms decrease and stop interfering with your daily activities at home or work. This may mean that you will start to:  Have better concentration and focus.  Sleep better.  Be less irritable.  Have more energy.  Have improved memory. It is important to recognize when your condition is  getting worse. Contact your health care provider if your symptoms interfere with home or work and you do not feel like your condition is improving. Where to find help and support: You can get help and support from these sources:  Self-help groups.  Online and [employer]  A trusted spiritual leader.  Couples counseling.  Family education classes.  Family therapy. Follow these instructions at home:  Eat a healthy diet that includes plenty of vegetables, fruits, whole grains, low-fat dairy products, and lean protein. Do not eat a lot of foods that are high in solid fats, added sugars, or salt.  Exercise. Most adults should do the following: ? Exercise for at least 150 minutes each week. The exercise should increase your heart rate and make you sweat (moderate-intensity exercise). ? Strengthening exercises at least twice a week.  Cut down on caffeine, tobacco, alcohol, and other potentially harmful substances.  Get the right amount and quality of sleep. Most adults need 7-9 hours of sleep each night.  Make choices that simplify your life.  Take over-the-counter and prescription medicines only as told by your health care provider.  Avoid caffeine, alcohol, and certain over-the-counter cold medicines. These may make you feel worse. Ask your pharmacist which medicines to avoid.  Keep all follow-up visits as told by your health care provider. This is important. Questions to ask your health care provider  Would I benefit from therapy?  How often should I follow up with a health care provider?  How long do I need to take medicine?  Are there any long-term side effects of my medicine?  Are there any alternatives to taking medicine? Contact a health care provider if:  You have a hard time staying focused or finishing daily tasks.  You spend many hours a day feeling worried about everyday life.  You become exhausted by worry.  You start to have headaches, feel tense, or have nausea.  You urinate more than normal.  You have diarrhea. Get help right away if:  You have a racing heart and shortness of breath.  You have thoughts of hurting yourself or others. If you ever feel like you may hurt yourself or others, or have  thoughts about taking your own life, get help right away. You can go to your nearest emergency department or call:  Your local emergency services (911 in the [place]  A suicide crisis helpline, such as the National Suicide Prevention Lifeline at 240-643-9100. This is open 24-hours a day. Summary  Taking steps to deal with stress can help calm you.  Medicines cannot cure anxiety disorders, but they can help ease symptoms.  Family, friends, and partners can play a big part in helping you recover from an anxiety disorder. This information is not intended to replace advice given to you by your health care provider. Make sure you discuss any questions you have with your health care provider. Document Released: 05/10/2016 Document Revised: 04/28/2017 Document Reviewed: 05/10/2016 Elsevier Patient Education  2020 [employer].

## 2019-03-29 NOTE — Progress Notes (Signed)
Subjective:  Patient ID: Alyssa Forbes, female    DOB: 1991-03-11  Age: 28 y.o. MRN: [id number]  CC: Establish Care (est care---anxiety and depression/insomnia/ getting worse---moved from [place])  Anxiety Presents for initial visit. Onset was more than 5 years ago. The problem has been waxing and waning. Symptoms include decreased concentration, depressed mood, excessive worry, insomnia, irritability, malaise, muscle tension, nervous/anxious behavior, palpitations, panic, restlessness, shortness of breath and suicidal ideas. Patient reports no chest pain, compulsions, confusion, dizziness, dry mouth, feeling of choking, hyperventilation, impotence, nausea or obsessions. Symptoms occur most days. The severity of symptoms is causing significant distress and interfering with daily activities. The symptoms are aggravated by social activities. The quality of sleep is poor. Nighttime awakenings: several, early a.m. for rest of night.   Her past medical history is significant for anxiety/panic attacks and depression. There is no history of anemia, arrhythmia, asthma, bipolar disorder, CAD, CHF, chronic lung disease, fibromyalgia, hyperthyroidism or suicide attempts. Past treatments include lifestyle changes and herbal remedies. The treatment provided no relief.  Constipation This is a chronic problem. The current episode started more than 1 year ago. The problem is unchanged. Her stool frequency is 2 to 3 times per week. The stool is described as pellet like and firm. The patient is not on a high fiber diet. She does not exercise regularly. There has not been adequate water intake. Associated symptoms include abdominal pain and bloating. Pertinent negatives include no anorexia, diarrhea, fecal incontinence, hematochezia, hemorrhoids, melena, nausea, rectal pain, vomiting or weight loss. Risk factors include stress. She has tried laxatives, fiber and stool softeners for the symptoms. The treatment  provided moderate relief. Her past medical history is significant for irritable bowel syndrome and psychiatric history. There is no history of endocrine disease or inflammatory bowel disease.  Hx of migraine Intermittent thoughts of death, never planned to hurt self, never attempted to hurt self.  Depression screen PHQ 2/9 03/29/2019  Decreased Interest 3  Down, Depressed, Hopeless 3  PHQ - 2 Score 6  Altered sleeping 3  Tired, decreased energy 3  Change in appetite 1  Feeling bad or failure about yourself  3  Trouble concentrating 3  Moving slowly or fidgety/restless 1  Suicidal thoughts 1  PHQ-9 Score 21   GAD 7 : Generalized Anxiety Score 03/29/2019  Nervous, Anxious, on Edge 3  Control/stop worrying 3  Worry too much - different things 3  Trouble relaxing 3  Restless 3  Easily annoyed or irritable 3  Afraid - awful might happen 2  Total GAD 7 Score 20   Social History   Socioeconomic History  . Marital status: Single    Spouse name: Not on file  . Number of children: 0  . Years of education: Not on file  . Highest education level: Not on file  Occupational History  . Not on file  Social Needs  . Financial resource strain: Not on file  . Food insecurity    Worry: Not on file    Inability: Not on file  . Transportation needs    Medical: Not on file    Non-medical: Not on file  Tobacco Use  . Smoking status: Never Smoker  . Smokeless tobacco: Never Used  Substance and Sexual Activity  . Alcohol use: Not Currently  . Drug use: No  . Sexual activity: Yes    Birth control/protection: Pill  Lifestyle  . Physical activity    Days per week: Not on file  Minutes per session: Not on file  . Stress: Not on file  Relationships  . Social Herbalist on phone: Not on file    Gets together: Not on file    Attends religious service: Not on file    Active member of club or organization: Not on file    Attends meetings of clubs or organizations: Not on file     Relationship status: Not on file  . Intimate partner violence    Fear of current or ex partner: Not on file    Emotionally abused: Not on file    Physically abused: Not on file    Forced sexual activity: Not on file  Other Topics Concern  . Not on file  Social History Narrative  . Not on file   Past Medical History:  Diagnosis Date  . Anemia   . Anxiety   . Back pain   . Depression    Past Surgical History:  Procedure Laterality Date  . TONSILLECTOMY    . TONSILLECTOMY  2009   Family History  Problem Relation Age of Onset  . Hypertension Mother   . Epilepsy Mother   . Depression Mother   . Hyperlipidemia Father   . Depression Father   . Heart disease Maternal Grandmother   . Stroke Maternal Grandfather   . Diabetes Paternal Grandmother   . Dementia Paternal Grandfather     Outpatient Medications Prior to Visit  Medication Sig Dispense Refill  . acetaminophen (TYLENOL) 500 MG tablet Take 500 mg by mouth every 6 (six) hours as needed.    . Norethin Ace-Eth Estrad-FE (AUROVELA FE 1/20 PO) Take by mouth.    . naproxen sodium (ALEVE) 220 MG tablet Take 220 mg by mouth.    Marland Kitchen ibuprofen (ADVIL,MOTRIN) 600 MG tablet Take 1 tablet (600 mg total) by mouth every 6 (six) hours as needed. (Patient not taking: Reported on 03/29/2019) 30 tablet 0  . lidocaine (LIDODERM) 5 % Place 1 patch onto the skin daily. Remove & Discard patch within 12 hours or as directed by MD (Patient not taking: Reported on 03/29/2019) 15 patch 0   No facility-administered medications prior to visit.     ROS See HPI  Objective:  BP 122/90   Pulse 94   Temp 98.4 F (36.9 C) (Tympanic)   Ht 5\' 6"  (1.676 m)   Wt 278 lb 6.4 oz (126.3 kg)   SpO2 97%   BMI 44.93 kg/m   BP Readings from Last 3 Encounters:  03/29/19 122/90  06/09/18 (!) 125/100  06/05/18 133/88    Wt Readings from Last 3 Encounters:  03/29/19 278 lb 6.4 oz (126.3 kg)  06/09/18 260 lb (117.9 kg)  06/05/18 262 lb (118.8 kg)     Physical Exam Vitals signs reviewed.  Constitutional:      Appearance: She is obese.  Neck:     Musculoskeletal: Normal range of motion and neck supple.  Cardiovascular:     Rate and Rhythm: Normal rate and regular rhythm.     Pulses: Normal pulses.     Heart sounds: Normal heart sounds.  Musculoskeletal:     Right lower leg: No edema.     Left lower leg: No edema.  Lymphadenopathy:     Cervical: No cervical adenopathy.  Neurological:     Mental Status: She is alert and oriented to person, place, and time.  Psychiatric:        Attention and Perception: Attention normal.  Mood and Affect: Mood is anxious. Affect is tearful.        Speech: Speech normal.        Behavior: Behavior is cooperative.        Thought Content: Thought content is not paranoid or delusional. Thought content does not include homicidal or suicidal ideation. Thought content does not include homicidal or suicidal plan.        Cognition and Memory: Cognition and memory normal.        Judgment: Judgment normal.    Lab Results  Component Value Date   WBC 6.4 03/29/2019   HGB 12.6 03/29/2019   HCT 39.6 03/29/2019   PLT 381.0 03/29/2019   GLUCOSE 88 03/29/2019   ALT 12 03/29/2019   AST 16 03/29/2019   NA 140 03/29/2019   K 4.2 03/29/2019   CL 107 03/29/2019   CREATININE 1.16 03/29/2019   BUN 15 03/29/2019   CO2 26 03/29/2019   TSH 4.11 03/29/2019   Assessment & Plan:   Rossy was seen today for establish care.  Diagnoses and all orders for this visit:  Adjustment disorder with mixed anxiety and depressed mood -     Comprehensive metabolic panel -     CBC -     Discontinue: amitriptyline (ELAVIL) 25 MG tablet; Take 1 tablet (25 mg total) by mouth at bedtime. -     Ambulatory referral to Psychology -     amitriptyline (ELAVIL) 25 MG tablet; Take 1 tablet (25 mg total) by mouth at bedtime.  Adjustment insomnia -     TSH -     Discontinue: amitriptyline (ELAVIL) 25 MG tablet; Take 1 tablet  (25 mg total) by mouth at bedtime. -     amitriptyline (ELAVIL) 25 MG tablet; Take 1 tablet (25 mg total) by mouth at bedtime.  Morbid obesity (HCC) -     TSH -     VITAMIN D 25 Hydroxy (Vit-D Deficiency, Fractures) -     CBC -     Vitamin D, Ergocalciferol, (DRISDOL) 1.25 MG (50000 UT) CAPS capsule; Take 1 capsule (50,000 Units total) by mouth every 7 (seven) days. -     Vitamin D 1,25 dihydroxy; Future  Chronic idiopathic constipation -     senna-docusate (SENOKOT-S) 8.6-50 MG tablet; Take 1 tablet by mouth at bedtime.  Vitamin D deficiency -     Vitamin D, Ergocalciferol, (DRISDOL) 1.25 MG (50000 UT) CAPS capsule; Take 1 capsule (50,000 Units total) by mouth every 7 (seven) days. -     Vitamin D 1,25 dihydroxy; Future   I have discontinued Ethie Rick's lidocaine and naproxen sodium. I am also having her start on senna-docusate and Vitamin D (Ergocalciferol). Additionally, I am having her maintain her ibuprofen, acetaminophen, Norethin Ace-Eth Estrad-FE (AUROVELA FE 1/20 PO), and amitriptyline.  Meds ordered this encounter  Medications  . DISCONTD: amitriptyline (ELAVIL) 25 MG tablet    Sig: Take 1 tablet (25 mg total) by mouth at bedtime.    Dispense:  14 tablet    Refill:  1    Order Specific Question:   Supervising Provider    Answer:   MATTHEWS, CODY [[id number]]  . senna-docusate (SENOKOT-S) 8.6-50 MG tablet    Sig: Take 1 tablet by mouth at bedtime.    Dispense:  30 tablet    Refill:  0    Order Specific Question:   Supervising Provider    Answer:   MATTHEWS, CODY [[id number]]  . amitriptyline (ELAVIL) 25 MG tablet  Sig: Take 1 tablet (25 mg total) by mouth at bedtime.    Dispense:  14 tablet    Refill:  1    Order Specific Question:   Supervising Provider    Answer:   MATTHEWS, CODY [[id number]]  . Vitamin D, Ergocalciferol, (DRISDOL) 1.25 MG (50000 UT) CAPS capsule    Sig: Take 1 capsule (50,000 Units total) by mouth every 7 (seven) days.    Dispense:  8 capsule    Refill:  0     Order Specific Question:   Supervising Provider    Answer:   Dianne Dun [[id number]]    Problem List Items Addressed This Visit      Other   Adjustment disorder with mixed anxiety and depressed mood - Primary   Relevant Medications   amitriptyline (ELAVIL) 25 MG tablet   Other Relevant Orders   Comprehensive metabolic panel (Completed)   CBC (Completed)   Ambulatory referral to Psychology   Adjustment insomnia   Relevant Medications   amitriptyline (ELAVIL) 25 MG tablet   Other Relevant Orders   TSH (Completed)   Vitamin D deficiency   Relevant Medications   Vitamin D, Ergocalciferol, (DRISDOL) 1.25 MG (50000 UT) CAPS capsule   Other Relevant Orders   Vitamin D 1,25 dihydroxy    Other Visit Diagnoses    Morbid obesity (HCC)       Relevant Medications   Vitamin D, Ergocalciferol, (DRISDOL) 1.25 MG (50000 UT) CAPS capsule   Other Relevant Orders   TSH (Completed)   VITAMIN D 25 Hydroxy (Vit-D Deficiency, Fractures) (Completed)   CBC (Completed)   Vitamin D 1,25 dihydroxy   Chronic idiopathic constipation       Relevant Medications   senna-docusate (SENOKOT-S) 8.6-50 MG tablet      Follow-up: Return in about 2 weeks (around 04/12/2019) for anxiety ( , F2F).  [name], NP

## 2019-04-01 DIAGNOSIS — E559 Vitamin D deficiency, unspecified: Secondary | ICD-10-CM | POA: Insufficient documentation

## 2019-04-01 MED ORDER — VITAMIN D (ERGOCALCIFEROL) 1.25 MG (50000 UNIT) PO CAPS: 50000.0000 [IU] | ORAL_CAPSULE | ORAL | 0 refills | Status: DC

## 2019-04-08 ENCOUNTER — Other Ambulatory Visit: Payer: Self-pay | Admitting: *Deleted

## 2019-04-08 DIAGNOSIS — Z20822 Contact with and (suspected) exposure to covid-19: Secondary | ICD-10-CM

## 2019-04-09 LAB — NOVEL CORONAVIRUS, NAA: SARS-CoV-2, NAA: NOT DETECTED

## 2019-04-12 ENCOUNTER — Ambulatory Visit: Payer: Self-pay | Admitting: Nurse Practitioner

## 2019-06-06 ENCOUNTER — Telehealth: Payer: Self-pay | Admitting: Nurse Practitioner

## 2019-06-06 NOTE — Telephone Encounter (Signed)
Received refill request for Amitriptyline 25 mg from [facility] on [address].   LVM for the pt to call back, need to offer an f/u with Nche for anxiety. Last ov was 03/29/2019--suppose to have 2 wks follow up since that date.

## 2019-06-07 NOTE — Telephone Encounter (Signed)
Appt set on 06/12/2019.

## 2019-06-12 ENCOUNTER — Telehealth: Payer: Self-pay | Admitting: Nurse Practitioner

## 2019-06-12 ENCOUNTER — Other Ambulatory Visit: Payer: Self-pay

## 2019-06-12 ENCOUNTER — Encounter: Payer: Self-pay | Admitting: Nurse Practitioner

## 2019-06-12 ENCOUNTER — Telehealth (INDEPENDENT_AMBULATORY_CARE_PROVIDER_SITE_OTHER): Payer: Self-pay | Admitting: Nurse Practitioner

## 2019-06-12 VITALS — BP 121/89 | Temp 95.5°F | Ht 66.0 in | Wt 277.0 lb

## 2019-06-12 DIAGNOSIS — Z889 Allergy status to unspecified drugs, medicaments and biological substances status: Secondary | ICD-10-CM | POA: Insufficient documentation

## 2019-06-12 DIAGNOSIS — F32A Depression, unspecified: Secondary | ICD-10-CM | POA: Insufficient documentation

## 2019-06-12 DIAGNOSIS — F329 Major depressive disorder, single episode, unspecified: Secondary | ICD-10-CM | POA: Insufficient documentation

## 2019-06-12 DIAGNOSIS — E559 Vitamin D deficiency, unspecified: Secondary | ICD-10-CM

## 2019-06-12 DIAGNOSIS — F3341 Major depressive disorder, recurrent, in partial remission: Secondary | ICD-10-CM

## 2019-06-12 DIAGNOSIS — F419 Anxiety disorder, unspecified: Secondary | ICD-10-CM | POA: Insufficient documentation

## 2019-06-12 DIAGNOSIS — K219 Gastro-esophageal reflux disease without esophagitis: Secondary | ICD-10-CM | POA: Insufficient documentation

## 2019-06-12 MED ORDER — ESCITALOPRAM OXALATE 5 MG PO TABS: ORAL_TABLET | ORAL | 2 refills | Status: DC

## 2019-06-12 MED ORDER — VITAMIN D (ERGOCALCIFEROL) 1.25 MG (50000 UNIT) PO CAPS: 50000.0000 [IU] | ORAL_CAPSULE | ORAL | 0 refills | Status: DC

## 2019-06-12 MED ORDER — TRAZODONE HCL 50 MG PO TABS: 25.0000 mg | ORAL_TABLET | Freq: Every evening | ORAL | 0 refills | Status: DC | PRN

## 2019-06-12 NOTE — Telephone Encounter (Signed)
Rx cancel from [facility] and resend to [facility]. Pt is aware.

## 2019-06-12 NOTE — Progress Notes (Signed)
Virtual Visit via Video Note  I connected with Jamie Kato on 06/12/19 at  9:30 AM EST by a video enabled telemedicine application and verified that I am speaking with the correct person using two identifiers.  Location: Patient:home Provider:office Participants: patient and provider I discussed the limitations of evaluation and management by telemedicine and the availability of in person appointments. The patient expressed understanding and agreed to proceed.  KZ:[id number] up on Anxiety--insomnia side effects from med and still anxouse during the day since taking med at night   History of Present Illness: Anxiety and depression: Improved mood with elavil, but does not improve sleep quality. Reports worsening anxiety during the day. Denies any SI or HI. Has not made appt with therapist due to lack of insurance.  Did not vitamin D as prescribed.   Observations/Objective: Physical Exam  Constitutional: She is oriented to person, place, and time.  Cardiovascular: Normal rate.  Pulmonary/Chest: Effort normal.  Neurological: She is alert and oriented to person, place, and time.  Psychiatric: She has a normal mood and affect. Her behavior is normal. Judgment and thought content normal.  Vitals reviewed.  Assessment and Plan: Baylei was seen today for follow-up.  Diagnoses and all orders for this visit:  Recurrent major depressive disorder, in partial remission (HCC) -     escitalopram (LEXAPRO) 5 MG tablet; Take 1 tablet (5 mg total) by mouth daily for 7 days, THEN 2 tablets (10 mg total) daily for 23 days. -     traZODone (DESYREL) 50 MG tablet; Take 0.5-1 tablets (25-50 mg total) by mouth at bedtime as needed for sleep.  Vitamin D deficiency -     Vitamin D, Ergocalciferol, (DRISDOL) 1.25 MG (50000 UNIT) CAPS capsule; Take 1 capsule (50,000 Units total) by mouth every 7 (seven) days.    Follow Up Instructions: See avs   I discussed the assessment and treatment plan with the  patient. The patient was provided an opportunity to ask questions and all were answered. The patient agreed with the plan and demonstrated an understanding of the instructions.   The patient was advised to call back or seek an in-person evaluation if the symptoms worsen or if the condition fails to improve as anticipated.  [name], NP

## 2019-06-12 NOTE — Telephone Encounter (Signed)
Patient is calling and wanted to see if her rx for Lexapro can be transferred to [facility] on [facility]. CB is [phone].

## 2019-06-28 ENCOUNTER — Telehealth: Payer: Self-pay | Admitting: Nurse Practitioner

## 2019-06-28 NOTE — Telephone Encounter (Signed)
Patient is calling and requesting to speak to someone regarding switching her Trazodone to something else due to pt not sleeping. CB [phone]

## 2019-07-12 ENCOUNTER — Telehealth (INDEPENDENT_AMBULATORY_CARE_PROVIDER_SITE_OTHER): Payer: Self-pay | Admitting: Nurse Practitioner

## 2019-07-12 ENCOUNTER — Encounter: Payer: Self-pay | Admitting: Nurse Practitioner

## 2019-07-12 ENCOUNTER — Other Ambulatory Visit: Payer: Self-pay

## 2019-07-12 VITALS — BP 128/87 | HR 73 | Temp 96.6°F | Ht 66.0 in | Wt 172.8 lb

## 2019-07-12 DIAGNOSIS — F3341 Major depressive disorder, recurrent, in partial remission: Secondary | ICD-10-CM

## 2019-07-12 DIAGNOSIS — F5102 Adjustment insomnia: Secondary | ICD-10-CM

## 2019-07-12 MED ORDER — ESCITALOPRAM OXALATE 10 MG PO TABS: 10.0000 mg | ORAL_TABLET | Freq: Every day | ORAL | 3 refills | Status: DC

## 2019-07-12 MED ORDER — TEMAZEPAM 7.5 MG PO CAPS: 7.5000 mg | ORAL_CAPSULE | Freq: Every evening | ORAL | 0 refills | Status: DC | PRN

## 2019-07-12 NOTE — Progress Notes (Signed)
Virtual Visit via Video Note  I connected with@ on 07/12/19 at  9:45 AM EST by a video enabled telemedicine application and verified that I am speaking with the correct person using two identifiers.  Location: Patient:Home Provider: Office Participants: patient and provider  I discussed the limitations of evaluation and management by telemedicine and the availability of in person appointments. I also discussed with the patient that there may be a patient responsible charge related to this service. The patient expressed understanding and agreed to proceed.  CC:pt states that Alyssa Forbes suffers with insomnia, trazadone isn't helping but other than that anxiety meds working  History of Present Illness: Reports improved mood with lexapro.  Reports persistent insomnia despite use of benadryl, melatonin, chamomile tea, relaxation techniques, eliminating electronics at bedtime, and darkened curtains. No improvement with elavil and trazodone. Depression screen [facility] 2/9 06/12/2019 03/29/2019  Decreased Interest 0 3  Down, Depressed, Hopeless 1 3  PHQ - 2 Score 1 6  Altered sleeping 3 3  Tired, decreased energy 3 3  Change in appetite 1 1  Feeling bad or failure about yourself  0 3  Trouble concentrating 0 3  Moving slowly or fidgety/restless 0 1  Suicidal thoughts 0 1  PHQ-9 Score 8 21   GAD 7 : Generalized Anxiety Score 06/12/2019 03/29/2019  Nervous, Anxious, on Edge 3 3  Control/stop worrying 1 3  Worry too much - different things 1 3  Trouble relaxing 3 3  Restless 0 3  Easily annoyed or irritable 3 3  Afraid - awful might happen 0 2  Total GAD 7 Score 11 20   Observations/Objective: Physical Exam  Constitutional: Alyssa Forbes is oriented to person, place, and time.  Pulmonary/Chest: Effort normal.  Neurological: Alyssa Forbes is alert and oriented to person, place, and time.  Psychiatric: Alyssa Forbes has a normal mood and affect. Her behavior is normal. Thought content normal.   Assessment and Plan: Alyssa Forbes  was seen today for follow-up.  Diagnoses and all orders for this visit:  Adjustment insomnia -     temazepam (RESTORIL) 7.5 MG capsule; Take 1 capsule (7.5 mg total) by mouth at bedtime as needed for sleep.  Recurrent major depressive disorder, in partial remission (HCC) -     escitalopram (LEXAPRO) 10 MG tablet; Take 1 tablet (10 mg total) by mouth daily.    Follow Up Instructions: See avs   I discussed the assessment and treatment plan with the patient. The patient was provided an opportunity to ask questions and all were answered. The patient agreed with the plan and demonstrated an understanding of the instructions.   The patient was advised to call back or seek an in-person evaluation if the symptoms worsen or if the condition fails to improve as anticipated.  [name], NP

## 2019-07-12 NOTE — Patient Instructions (Signed)
Call office and schedule 46month f/up and lab appt for repeat vitamin D.  Maintain lexapro dose at 10mg  Stop trazodone Start restoril for sleep, if not effective will enter referral to sleep clinic.   Insomnia Insomnia is a sleep disorder that makes it difficult to fall asleep or stay asleep. Insomnia can cause fatigue, low energy, difficulty concentrating, mood swings, and poor performance at work or school. There are three different ways to classify insomnia:  Difficulty falling asleep.  Difficulty staying asleep.  Waking up too early in the morning. Any type of insomnia can be long-term (chronic) or short-term (acute). Both are common. Short-term insomnia usually lasts for three months or less. Chronic insomnia occurs at least three times a week for longer than three months. What are the causes? Insomnia may be caused by another condition, situation, or substance, such as:  Anxiety.  Certain medicines.  Gastroesophageal reflux disease (GERD) or other gastrointestinal conditions.  Asthma or other breathing conditions.  Restless legs syndrome, sleep apnea, or other sleep disorders.  Chronic pain.  Menopause.  Stroke.  Abuse of alcohol, tobacco, or illegal drugs.  Mental health conditions, such as depression.  Caffeine.  Neurological disorders, such as Alzheimer's disease.  An overactive thyroid (hyperthyroidism). Sometimes, the cause of insomnia may not be known. What increases the risk? Risk factors for insomnia include:  Gender. Women are affected more often than men.  Age. Insomnia is more common as you get older.  Stress.  Lack of exercise.  Irregular work schedule or working night shifts.  Traveling between different time zones.  Certain medical and mental health conditions. What are the signs or symptoms? If you have insomnia, the main symptom is having trouble falling asleep or having trouble staying asleep. This may lead to other symptoms, such  as:  Feeling fatigued or having low energy.  Feeling nervous about going to sleep.  Not feeling rested in the morning.  Having trouble concentrating.  Feeling irritable, anxious, or depressed. How is this diagnosed? This condition may be diagnosed based on:  Your symptoms and medical history. Your health care provider may ask about: ? Your sleep habits. ? Any medical conditions you have. ? Your mental health.  A physical exam. How is this treated? Treatment for insomnia depends on the cause. Treatment may focus on treating an underlying condition that is causing insomnia. Treatment may also include:  Medicines to help you sleep.  Counseling or therapy.  Lifestyle adjustments to help you sleep better. Follow these instructions at home: Eating and drinking   Limit or avoid alcohol, caffeinated beverages, and cigarettes, especially close to bedtime. These can disrupt your sleep.  Do not eat a large meal or eat spicy foods right before bedtime. This can lead to digestive discomfort that can make it hard for you to sleep. Sleep habits   Keep a sleep diary to help you and your health care provider figure out what could be causing your insomnia. Write down: ? When you sleep. ? When you wake up during the night. ? How well you sleep. ? How rested you feel the next day. ? Any side effects of medicines you are taking. ? What you eat and drink.  Make your bedroom a dark, comfortable place where it is easy to fall asleep. ? Put up shades or blackout curtains to block light from outside. ? Use a white noise machine to block noise. ? Keep the temperature cool.  Limit screen use before bedtime. This includes: ? Watching TV. ?  Using your smartphone, tablet, or computer.  Stick to a routine that includes going to bed and waking up at the same times every day and night. This can help you fall asleep faster. Consider making a quiet activity, such as reading, part of your nighttime  routine.  Try to avoid taking naps during the day so that you sleep better at night.  Get out of bed if you are still awake after 15 minutes of trying to sleep. Keep the lights down, but try reading or doing a quiet activity. When you feel sleepy, go back to bed. General instructions  Take over-the-counter and prescription medicines only as told by your health care provider.  Exercise regularly, as told by your health care provider. Avoid exercise starting several hours before bedtime.  Use relaxation techniques to manage stress. Ask your health care provider to suggest some techniques that may work well for you. These may include: ? Breathing exercises. ? Routines to release muscle tension. ? Visualizing peaceful scenes.  Make sure that you drive carefully. Avoid driving if you feel very sleepy.  Keep all follow-up visits as told by your health care provider. This is important. Contact a health care provider if:  You are tired throughout the day.  You have trouble in your daily routine due to sleepiness.  You continue to have sleep problems, or your sleep problems get worse. Get help right away if:  You have serious thoughts about hurting yourself or someone else. If you ever feel like you may hurt yourself or others, or have thoughts about taking your own life, get help right away. You can go to your nearest emergency department or call:  Your local emergency services (911 in the [place]  A suicide crisis helpline, such as the National Suicide Prevention Lifeline at 770 395 7217. This is open 24 hours a day. Summary  Insomnia is a sleep disorder that makes it difficult to fall asleep or stay asleep.  Insomnia can be long-term (chronic) or short-term (acute).  Treatment for insomnia depends on the cause. Treatment may focus on treating an underlying condition that is causing insomnia.  Keep a sleep diary to help you and your health care provider figure out what could be  causing your insomnia. This information is not intended to replace advice given to you by your health care provider. Make sure you discuss any questions you have with your health care provider. Document Revised: 04/28/2017 Document Reviewed: 02/23/2017 Elsevier Patient Education  2020 [employer].

## 2019-07-14 ENCOUNTER — Encounter: Payer: Self-pay | Admitting: Nurse Practitioner

## 2019-07-14 DIAGNOSIS — G43909 Migraine, unspecified, not intractable, without status migrainosus: Secondary | ICD-10-CM | POA: Insufficient documentation

## 2019-07-14 NOTE — Assessment & Plan Note (Signed)
Reports persistent insomnia despite use of benadryl, melatonin, chamomile tea, relaxation techniques, eliminating electronics at bedtime, and darkened curtains. No improvement with elavil and trazodone.  Will try restoril If no improvement, refer to sleep clinic

## 2019-07-14 NOTE — Assessment & Plan Note (Signed)
Improved with lexapro. 

## 2019-07-16 NOTE — Telephone Encounter (Signed)
Left message on voicemail to call office.  

## 2019-07-23 NOTE — Telephone Encounter (Signed)
Pt has had a visit in regards to this TE w/ [facility].

## 2019-07-31 ENCOUNTER — Other Ambulatory Visit: Payer: Medicaid Other

## 2019-08-01 ENCOUNTER — Ambulatory Visit: Payer: Medicaid Other | Attending: Internal Medicine

## 2019-08-01 DIAGNOSIS — Z20822 Contact with and (suspected) exposure to covid-19: Secondary | ICD-10-CM

## 2019-08-02 LAB — NOVEL CORONAVIRUS, NAA: SARS-CoV-2, NAA: NOT DETECTED

## 2019-11-11 ENCOUNTER — Ambulatory Visit: Payer: Medicaid Other | Attending: Internal Medicine

## 2019-11-11 ENCOUNTER — Other Ambulatory Visit: Payer: Medicaid Other

## 2019-11-11 DIAGNOSIS — Z20822 Contact with and (suspected) exposure to covid-19: Secondary | ICD-10-CM

## 2019-11-12 LAB — SARS-COV-2, NAA 2 DAY TAT

## 2019-11-12 LAB — NOVEL CORONAVIRUS, NAA: SARS-CoV-2, NAA: NOT DETECTED

## 2019-11-25 ENCOUNTER — Other Ambulatory Visit: Payer: Self-pay

## 2019-11-26 ENCOUNTER — Ambulatory Visit (INDEPENDENT_AMBULATORY_CARE_PROVIDER_SITE_OTHER): Payer: Self-pay | Admitting: Nurse Practitioner

## 2019-11-26 ENCOUNTER — Encounter: Payer: Self-pay | Admitting: Nurse Practitioner

## 2019-11-26 VITALS — BP 110/62 | HR 88 | Temp 98.3°F | Ht 66.0 in | Wt 274.0 lb

## 2019-11-26 DIAGNOSIS — Z136 Encounter for screening for cardiovascular disorders: Secondary | ICD-10-CM

## 2019-11-26 DIAGNOSIS — Z13220 Encounter for screening for lipoid disorders: Secondary | ICD-10-CM

## 2019-11-26 DIAGNOSIS — G8929 Other chronic pain: Secondary | ICD-10-CM

## 2019-11-26 DIAGNOSIS — F5102 Adjustment insomnia: Secondary | ICD-10-CM

## 2019-11-26 DIAGNOSIS — F3341 Major depressive disorder, recurrent, in partial remission: Secondary | ICD-10-CM

## 2019-11-26 DIAGNOSIS — G43709 Chronic migraine without aura, not intractable, without status migrainosus: Secondary | ICD-10-CM

## 2019-11-26 DIAGNOSIS — E559 Vitamin D deficiency, unspecified: Secondary | ICD-10-CM

## 2019-11-26 DIAGNOSIS — M545 Low back pain: Secondary | ICD-10-CM

## 2019-11-26 DIAGNOSIS — Z6841 Body Mass Index (BMI) 40.0 and over, adult: Secondary | ICD-10-CM

## 2019-11-26 LAB — BASIC METABOLIC PANEL
BUN: 13 mg/dL (ref 6–23)
CO2: 27 mEq/L (ref 19–32)
Calcium: 9.5 mg/dL (ref 8.4–10.5)
Chloride: 103 mEq/L (ref 96–112)
Creatinine, Ser: 1.27 mg/dL — ABNORMAL HIGH (ref 0.40–1.20)
GFR: 60.07 mL/min (ref >60.00)
Glucose, Bld: 89 mg/dL (ref 70–99)
Potassium: 3.9 mEq/L (ref 3.5–5.1)
Sodium: 136 mEq/L (ref 135–145)

## 2019-11-26 LAB — TSH: TSH: 6.97 u[IU]/mL — ABNORMAL HIGH (ref 0.35–4.50)

## 2019-11-26 LAB — LIPID PANEL
Cholesterol: 152 mg/dL (ref 0–200)
HDL: 42.9 mg/dL (ref >39.00)
LDL Cholesterol: 90 mg/dL (ref 0–99)
NonHDL: 109.49
Total CHOL/HDL Ratio: 4
Triglycerides: 97 mg/dL (ref 0.0–149.0)
VLDL: 19.4 mg/dL (ref 0.0–40.0)

## 2019-11-26 LAB — CBC WITH DIFFERENTIAL/PLATELET
Basophils Absolute: 0.1 10*3/uL (ref 0.0–0.1)
Basophils Relative: 0.9 % (ref 0.0–3.0)
Eosinophils Absolute: 0.1 10*3/uL (ref 0.0–0.7)
Eosinophils Relative: 1 % (ref 0.0–5.0)
HCT: 38.5 % (ref 36.0–46.0)
Hemoglobin: 12.7 g/dL (ref 12.0–15.0)
Lymphocytes Relative: 38.9 % (ref 12.0–46.0)
Lymphs Abs: 3 10*3/uL (ref 0.7–4.0)
MCHC: 33.1 g/dL (ref 30.0–36.0)
MCV: 86.2 fl (ref 78.0–100.0)
Monocytes Absolute: 0.5 10*3/uL (ref 0.1–1.0)
Monocytes Relative: 6.6 % (ref 3.0–12.0)
Neutro Abs: 4.1 10*3/uL (ref 1.4–7.7)
Neutrophils Relative %: 52.6 % (ref 43.0–77.0)
Platelets: 386 10*3/uL (ref 150.0–400.0)
RBC: 4.46 Mil/uL (ref 3.87–5.11)
RDW: 13.6 % (ref 11.5–15.5)
WBC: 7.8 10*3/uL (ref 4.0–10.5)

## 2019-11-26 LAB — POCT URINE PREGNANCY: Preg Test, Ur: NEGATIVE

## 2019-11-26 LAB — HEMOGLOBIN A1C: Hgb A1c MFr Bld: 5.5 % (ref 4.6–6.5)

## 2019-11-26 MED ORDER — KETOROLAC TROMETHAMINE 30 MG/ML IJ SOLN
30.0000 mg | Freq: Once | INTRAMUSCULAR | Status: AC
  Administered 2019-11-26: 30 mg via INTRAMUSCULAR

## 2019-11-26 NOTE — Progress Notes (Signed)
Subjective:  Patient ID: Alyssa Forbes, female    DOB: [birth year]  Age: 29 y.o. 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 The current episode started more than 1 year ago. The problem occurs daily. The problem has been waxing and waning. The pain is located in the frontal and occipital region. The pain does not radiate. The pain quality is similar to prior headaches. The quality of the pain is described as aching and squeezing. Associated symptoms include back pain, insomnia, nausea and photophobia. Pertinent negatives include no abdominal pain, anorexia, blurred vision, dizziness, drainage, ear pain, eye pain, eye redness, eye watering, fever, numbness, phonophobia, rhinorrhea, scalp tenderness, seizures, sinus pressure, tingling, tinnitus, visual change, weakness or weight loss. The symptoms are aggravated by fatigue, bright light and emotional stress. She has tried acetaminophen, Excedrin and NSAIDs for the symptoms. The treatment provided mild relief. Her past medical history is significant for migraine headaches and obesity. There is no history of hypertension, recent head traumas or sinus disease.  Back Pain The problem occurs intermittently. The problem has been waxing and waning since onset. The pain is present in the lumbar spine. The quality of the pain is described as aching. The pain does not radiate. The pain is worse during the night. The symptoms are aggravated by standing. Pertinent negatives include no abdominal pain, bladder incontinence, bowel incontinence, chest pain, dysuria, fever, leg pain, numbness, paresis, paresthesias, pelvic pain, tingling, weakness or weight loss. Risk factors include obesity, poor posture, sedentary lifestyle and lack of exercise. She has tried NSAIDs  for the symptoms. The treatment provided mild relief.  last eye exam 76yrs ago, use of corrective lens. 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  Reports inadequate sleep due to night shift. No improvement with Restoril, trazodone and elavil.  Depression screen [facility] 2/9 11/26/2019 06/12/2019 03/29/2019  Decreased Interest 0 0 3  Down, Depressed, Hopeless 0 1 3  PHQ - 2 Score 0 1 6  Altered sleeping 3 3 3   Tired, decreased energy 2 3 3   Change in appetite 1 1 1   Feeling bad or failure about yourself  0 0 3  Trouble concentrating 0 0 3  Moving slowly or fidgety/restless 0 0 1  Suicidal thoughts 0 0 1  PHQ-9 Score 6 8 21    GAD 7 : Generalized Anxiety Score 11/26/2019 06/12/2019 03/29/2019  Nervous, Anxious, on Edge 0 3 3  Control/stop worrying 1 1 3   Worry too much - different things 1 1 3   Trouble relaxing 1 3 3   Restless 0 0 3  Easily annoyed or irritable 1 3 3   Afraid - awful might happen 0 0 2  Total GAD 7 Score 4 11 20     Reviewed past Medical, Social and Family history today.  Outpatient Medications Prior to Visit  Medication Sig Dispense Refill  . acetaminophen (TYLENOL) 500 MG tablet Take 500 mg by mouth every 6 (six) hours as needed.    escitalopram (LEXAPRO) 10 MG tablet Take 1 tablet (10 mg total) by mouth daily. 90 tablet 3  . Norethin Ace-Eth Estrad-FE (AUROVELA FE 1/20 PO) Take by mouth.    . senna-docusate (SENOKOT-S) 8.6-50 MG tablet Take 1 tablet by mouth at bedtime. 30  tablet 0  . Vitamin D, Ergocalciferol, (DRISDOL) 1.25 MG (50000 UNIT) CAPS capsule Take 1 capsule (50,000 Units total) by mouth every 7 (seven) days. 8 capsule 0  . temazepam (RESTORIL) 7.5 MG capsule Take 1 capsule (7.5 mg total) by mouth at bedtime as needed for sleep. (Patient not taking: Reported on 11/26/2019) 7 capsule 0   No facility-administered medications prior to visit.    ROS See HPI  Objective:  BP 110/62   Pulse 88   Temp 98.3 F (36.8  C) (Tympanic)   Ht 5\' 6"  (1.676 m)   Wt 274 lb (124.3 kg)   SpO2 98%   BMI 44.22 kg/m   BP Readings from Last 3 Encounters:  11/26/19 110/62  07/12/19 128/87  06/12/19 121/89    Wt Readings from Last 3 Encounters:  11/26/19 274 lb (124.3 kg)  07/12/19 172 lb 12.8 oz (78.4 kg)  06/12/19 277 lb (125.6 kg)    Physical Exam Constitutional:      Appearance: She is obese.  Cardiovascular:     Rate and Rhythm: Normal rate and regular rhythm.     Pulses: Normal pulses.     Heart sounds: Normal heart sounds.  Pulmonary:     Effort: Pulmonary effort is normal.     Breath sounds: Normal breath sounds.  Abdominal:     General: Bowel sounds are normal.     Palpations: Abdomen is soft.  Musculoskeletal:        General: Tenderness present.     Right lower leg: No edema.     Left lower leg: No edema.  Neurological:     Mental Status: She is alert and oriented to person, place, and time.  Psychiatric:        Mood and Affect: Mood normal.        Behavior: Behavior normal.        Thought Content: Thought content normal.     Lab Results  Component Value Date   WBC 7.8 11/26/2019   HGB 12.7 11/26/2019   HCT 38.5 11/26/2019   PLT 386.0 11/26/2019   GLUCOSE 89 11/26/2019   CHOL 152 11/26/2019   TRIG 97.0 11/26/2019   HDL 42.90 11/26/2019   LDLCALC 90 11/26/2019   ALT 12 03/29/2019   AST 16 03/29/2019   NA 136 11/26/2019   K 3.9 11/26/2019   CL 103 11/26/2019   CREATININE 1.27 (H) 11/26/2019   BUN 13 11/26/2019   CO2 27 11/26/2019   TSH 6.97 (H) 11/26/2019   HGBA1C 5.5 11/26/2019    Assessment & Plan:  This visit occurred during the SARS-CoV-2 public health emergency.  Safety protocols were in place, including screening questions prior to the visit, additional usage of staff PPE, and extensive cleaning of exam room while observing appropriate contact time as indicated for disinfecting solutions.   Briony was seen today for migraine and back pain.  Diagnoses and all  orders for this visit:  Chronic migraine without aura without status migrainosus, not intractable -     ketorolac (TORADOL) 30 MG/ML injection 30 mg -     rizatriptan (MAXALT) 10 MG tablet; Take 1 tablet (10 mg total) by mouth as needed for migraine. No more than 30mg  in 24hrs  Vitamin D deficiency -     Vitamin D 1,25 dihydroxy  Recurrent major depressive disorder, in partial remission (HCC) -     TSH -     Basic metabolic panel -     CBC w/Diff  Morbid  obesity with BMI of 40.0-44.9, adult (HCC) -     TSH -     Lipid panel -     Hemoglobin A1c  Adjustment insomnia  Chronic midline low back pain without sciatica -     ketorolac (TORADOL) 30 MG/ML injection 30 mg -     Urinalysis w microscopic + reflex cultur -     POCT urine pregnancy  Encounter for lipid screening for cardiovascular disease -     Lipid panel  Other orders -     REFLEXIVE URINE CULTURE   I am having Deshanda Kinn "Quansh" start on rizatriptan. I am also having her maintain her acetaminophen, Norethin Ace-Eth Estrad-FE (AUROVELA FE 1/20 PO), senna-docusate, Vitamin D (Ergocalciferol), escitalopram, and temazepam. We administered ketorolac.  Meds ordered this encounter  Medications  . ketorolac (TORADOL) 30 MG/ML injection 30 mg  . rizatriptan (MAXALT) 10 MG tablet    Sig: Take 1 tablet (10 mg total) by mouth as needed for migraine. No more than 30mg  in 24hrs    Dispense:  10 tablet    Refill:  0    Order Specific Question:   Supervising Provider    Answer:   Overton Mam    Problem List Items Addressed This Visit      Cardiovascular and Mediastinum   Migraines - Primary   Relevant Medications   rizatriptan (MAXALT) 10 MG tablet     Other   Adjustment insomnia   Chronic midline low back pain without sciatica   Relevant Orders   Urinalysis w microscopic + reflex cultur (Completed)   POCT urine pregnancy (Completed)   Depression   Relevant Orders   TSH (Completed)   Basic  metabolic panel (Completed)   CBC w/Diff (Completed)   Morbid obesity with BMI of 40.0-44.9, adult (HCC)   Relevant Orders   TSH (Completed)   Lipid panel (Completed)   Hemoglobin A1c (Completed)   Vitamin D deficiency   Relevant Orders   Vitamin D 1,25 dihydroxy    Other Visit Diagnoses    Encounter for lipid screening for cardiovascular disease       Relevant Orders   Lipid panel (Completed)      Follow-up: Return in about 3 months (around 02/26/2020) for anxiety and depression (video, 02/28/2020).  , NP

## 2019-11-26 NOTE — Patient Instructions (Addendum)
Go to lab for blood draw and urine collection. Use maxalt for migraine  Schedule appt with ophthalmologist.  Need to make lifestyle changes: DASH diet, small portion and daily exercise (start with walking 30-10145mins a day).  Calorie Counting for Weight Loss Calories are units of energy. Your body needs a certain amount of calories from food to keep you going throughout the day. When you eat more calories than your body needs, your body stores the extra calories as fat. When you eat fewer calories than your body needs, your body burns fat to get the energy it needs. Calorie counting means keeping track of how many calories you eat and drink each day. Calorie counting can be helpful if you need to lose weight. If you make sure to eat fewer calories than your body needs, you should lose weight. Ask your health care provider what a healthy weight is for you. For calorie counting to work, you will need to eat the right number of calories in a day in order to lose a healthy amount of weight per week. A dietitian can help you determine how many calories you need in a day and will give you suggestions on how to reach your calorie goal.  A healthy amount of weight to lose per week is usually 1-2 lb (0.5-0.9 kg). This usually means that your daily calorie intake should be reduced by 500-750 calories.  Eating 1,200 - 1,500 calories per day can help most women lose weight.  Eating 1,500 - 1,800 calories per day can help most men lose weight. What is my plan? My goal is to have ___1500_______ calories per day. If I have this many calories per day, I should lose around _____1_____ pounds per week. What do I need to know about calorie counting? In order to meet your daily calorie goal, you will need to:  Find out how many calories are in each food you would like to eat. Try to do this before you eat.  Decide how much of the food you plan to eat.  Write down what you ate and how many calories it had. Doing  this is called keeping a food log. To successfully lose weight, it is important to balance calorie counting with a healthy lifestyle that includes regular activity. Aim for 150 minutes of moderate exercise (such as walking) or 75 minutes of vigorous exercise (such as running) each week. Where do I find calorie information?  The number of calories in a food can be found on a Nutrition Facts label. If a food does not have a Nutrition Facts label, try to look up the calories online or ask your dietitian for help. Remember that calories are listed per serving. If you choose to have more than one serving of a food, you will have to multiply the calories per serving by the amount of servings you plan to eat. For example, the label on a package of bread might say that a serving size is 1 slice and that there are 90 calories in a serving. If you eat 1 slice, you will have eaten 90 calories. If you eat 2 slices, you will have eaten 180 calories. How do I keep a food log? Immediately after each meal, record the following information in your food log:  What you ate. Don't forget to include toppings, sauces, and other extras on the food.  How much you ate. This can be measured in cups, ounces, or number of items.  How many calories each food  and drink had.  The total number of calories in the meal. Keep your food log near you, such as in a small notebook in your pocket, or use a mobile app or website. Some programs will calculate calories for you and show you how many calories you have left for the day to meet your goal. What are some calorie counting tips?   Use your calories on foods and drinks that will fill you up and not leave you hungry: ? Some examples of foods that fill you up are nuts and nut butters, vegetables, lean proteins, and high-fiber foods like whole grains. High-fiber foods are foods with more than 5 g fiber per serving. ? Drinks such as sodas, specialty coffee drinks, alcohol, and  juices have a lot of calories, yet do not fill you up.  Eat nutritious foods and avoid empty calories. Empty calories are calories you get from foods or beverages that do not have many vitamins or protein, such as candy, sweets, and soda. It is better to have a nutritious high-calorie food (such as an avocado) than a food with few nutrients (such as a bag of chips).  Know how many calories are in the foods you eat most often. This will help you calculate calorie counts faster.  Pay attention to calories in drinks. Low-calorie drinks include water and unsweetened drinks.  Pay attention to nutrition labels for "low fat" or "fat free" foods. These foods sometimes have the same amount of calories or more calories than the full fat versions. They also often have added sugar, starch, or salt, to make up for flavor that was removed with the fat.  Find a way of tracking calories that works for you. Get creative. Try different apps or programs if writing down calories does not work for you. What are some portion control tips?  Know how many calories are in a serving. This will help you know how many servings of a certain food you can have.  Use a measuring cup to measure serving sizes. You could also try weighing out portions on a kitchen scale. With time, you will be able to estimate serving sizes for some foods.  Take some time to put servings of different foods on your favorite plates, bowls, and cups so you know what a serving looks like.  Try not to eat straight from a bag or box. Doing this can lead to overeating. Put the amount you would like to eat in a cup or on a plate to make sure you are eating the right portion.  Use smaller plates, glasses, and bowls to prevent overeating.  Try not to multitask (for example, watch TV or use your computer) while eating. If it is time to eat, sit down at a table and enjoy your food. This will help you to know when you are full. It will also help you to be  aware of what you are eating and how much you are eating. What are tips for following this plan? Reading food labels  Check the calorie count compared to the serving size. The serving size may be smaller than what you are used to eating.  Check the source of the calories. Make sure the food you are eating is high in vitamins and protein and low in saturated and trans fats. Shopping  Read nutrition labels while you shop. This will help you make healthy decisions before you decide to purchase your food.  Make a grocery list and stick to it. Cooking  Try to cook your favorite foods in a healthier way. For example, try baking instead of frying.  Use low-fat dairy products. Meal planning  Use more fruits and vegetables. Half of your plate should be fruits and vegetables.  Include lean proteins like poultry and fish. How do I count calories when eating out?  Ask for smaller portion sizes.  Consider sharing an entree and sides instead of getting your own entree.  If you get your own entree, eat only half. Ask for a box at the beginning of your meal and put the rest of your entree in it so you are not tempted to eat it.  If calories are listed on the menu, choose the lower calorie options.  Choose dishes that include vegetables, fruits, whole grains, low-fat dairy products, and lean protein.  Choose items that are boiled, broiled, grilled, or steamed. Stay away from items that are buttered, battered, fried, or served with cream sauce. Items labeled "crispy" are usually fried, unless stated otherwise.  Choose water, low-fat milk, unsweetened iced tea, or other drinks without added sugar. If you want an alcoholic beverage, choose a lower calorie option such as a glass of wine or light beer.  Ask for dressings, sauces, and syrups on the side. These are usually high in calories, so you should limit the amount you eat.  If you want a salad, choose a garden salad and ask for grilled meats.  Avoid extra toppings like bacon, cheese, or fried items. Ask for the dressing on the side, or ask for olive oil and vinegar or lemon to use as dressing.  Estimate how many servings of a food you are given. For example, a serving of cooked rice is  cup or about the size of half a baseball. Knowing serving sizes will help you be aware of how much food you are eating at restaurants. The list below tells you how big or small some common portion sizes are based on everyday objects: ? 1 oz--4 stacked dice. ? 3 oz--1 deck of cards. ? 1 tsp--1 die. ? 1 Tbsp-- a ping-pong ball. ? 2 Tbsp--1 ping-pong ball. ?  cup-- baseball. ? 1 cup--1 baseball. Summary  Calorie counting means keeping track of how many calories you eat and drink each day. If you eat fewer calories than your body needs, you should lose weight.  A healthy amount of weight to lose per week is usually 1-2 lb (0.5-0.9 kg). This usually means reducing your daily calorie intake by 500-750 calories.  The number of calories in a food can be found on a Nutrition Facts label. If a food does not have a Nutrition Facts label, try to look up the calories online or ask your dietitian for help.  Use your calories on foods and drinks that will fill you up, and not on foods and drinks that will leave you hungry.  Use smaller plates, glasses, and bowls to prevent overeating. This information is not intended to replace advice given to you by your health care provider. Make sure you discuss any questions you have with your health care provider. Document Revised: 02/02/2018 Document Reviewed: 04/15/2016 Elsevier Patient Education  2020 [employer].   Exercising to Lose Weight Exercise is structured, repetitive physical activity to improve fitness and health. Getting regular exercise is important for everyone. It is especially important if you are overweight. Being overweight increases your risk of heart disease, stroke, diabetes, high blood  pressure, and several types of cancer. Reducing your calorie intake  and exercising can help you lose weight. Exercise is usually categorized as moderate or vigorous intensity. To lose weight, most people need to do a certain amount of moderate-intensity or vigorous-intensity exercise each week. Moderate-intensity exercise  Moderate-intensity exercise is any activity that gets you moving enough to burn at least three times more energy (calories) than if you were sitting. Examples of moderate exercise include:  Walking a mile in 15 minutes.  Doing light yard work.  Biking at an easy pace. Most people should get at least 150 minutes (2 hours and 30 minutes) a week of moderate-intensity exercise to maintain their body weight. Vigorous-intensity exercise Vigorous-intensity exercise is any activity that gets you moving enough to burn at least six times more calories than if you were sitting. When you exercise at this intensity, you should be working hard enough that you are not able to carry on a conversation. Examples of vigorous exercise include:  Running.  Playing a team sport, such as football, basketball, and soccer.  Jumping rope. Most people should get at least 75 minutes (1 hour and 15 minutes) a week of vigorous-intensity exercise to maintain their body weight. How can exercise affect me? When you exercise enough to burn more calories than you eat, you lose weight. Exercise also reduces body fat and builds muscle. The more muscle you have, the more calories you burn. Exercise also:  Improves mood.  Reduces stress and tension.  Improves your overall fitness, flexibility, and endurance.  Increases bone strength. The amount of exercise you need to lose weight depends on:  Your age.  The type of exercise.  Any health conditions you have.  Your overall physical ability. Talk to your health care provider about how much exercise you need and what types of activities are safe for  you. What actions can I take to lose weight? Nutrition   Make changes to your diet as told by your health care provider or diet and nutrition specialist (dietitian). This may include: ? Eating fewer calories. ? Eating more protein. ? Eating less unhealthy fats. ? Eating a diet that includes fresh fruits and vegetables, whole grains, low-fat dairy products, and lean protein. ? Avoiding foods with added fat, salt, and sugar.  Drink plenty of water while you exercise to prevent dehydration or heat stroke. Activity  Choose an activity that you enjoy and set realistic goals. Your health care provider can help you make an exercise plan that works for you.  Exercise at a moderate or vigorous intensity most days of the week. ? The intensity of exercise may vary from person to person. You can tell how intense a workout is for you by paying attention to your breathing and heartbeat. Most people will notice their breathing and heartbeat get faster with more intense exercise.  Do resistance training twice each week, such as: ? Push-ups. ? Sit-ups. ? Lifting weights. ? Using resistance bands.  Getting short amounts of exercise can be just as helpful as long structured periods of exercise. If you have trouble finding time to exercise, try to include exercise in your daily routine. ? Get up, stretch, and walk around every 30 minutes throughout the day. ? Go for a walk during your lunch break. ? Park your car farther away from your destination. ? If you take public transportation, get off one stop early and walk the rest of the way. ? Make phone calls while standing up and walking around. ? Take the stairs instead of elevators or escalators.  Wear comfortable clothes and shoes with good support.  Do not exercise so much that you hurt yourself, feel dizzy, or get very short of breath. Where to find more information  [place] Department of Health and Human Services: ThisPath.fi  Centers for  Disease Control and Prevention (CDC): [employer].br Contact a health care provider:  Before starting a new exercise program.  If you have questions or concerns about your weight.  If you have a medical problem that keeps you from exercising. Get help right away if you have any of the following while exercising:  Injury.  Dizziness.  Difficulty breathing or shortness of breath that does not go away when you stop exercising.  Chest pain.  Rapid heartbeat. Summary  Being overweight increases your risk of heart disease, stroke, diabetes, high blood pressure, and several types of cancer.  Losing weight happens when you burn more calories than you eat.  Reducing the amount of calories you eat in addition to getting regular moderate or vigorous exercise each week helps you lose weight. This information is not intended to replace advice given to you by your health care provider. Make sure you discuss any questions you have with your health care provider. Document Revised: 05/29/2017 Document Reviewed: 05/29/2017 Elsevier Patient Education  2020 [employer].

## 2019-11-28 DIAGNOSIS — G8929 Other chronic pain: Secondary | ICD-10-CM | POA: Insufficient documentation

## 2019-11-28 DIAGNOSIS — M545 Low back pain, unspecified: Secondary | ICD-10-CM | POA: Insufficient documentation

## 2019-11-28 MED ORDER — RIZATRIPTAN BENZOATE 10 MG PO TABS: 10.0000 mg | ORAL_TABLET | ORAL | 0 refills | Status: DC | PRN

## 2019-11-29 LAB — URINALYSIS W MICROSCOPIC + REFLEX CULTURE
Bacteria, UA: NONE SEEN /HPF
Bilirubin Urine: NEGATIVE
Glucose, UA: NEGATIVE
Hgb urine dipstick: NEGATIVE
Hyaline Cast: NONE SEEN /LPF
Ketones, ur: NEGATIVE
Leukocyte Esterase: NEGATIVE
Nitrites, Initial: NEGATIVE
Protein, ur: NEGATIVE
RBC / HPF: NONE SEEN /HPF (ref 0–2)
Specific Gravity, Urine: 1.028 (ref 1.001–1.03)
Squamous Epithelial / HPF: NONE SEEN /HPF (ref <=5)
WBC, UA: NONE SEEN /HPF (ref 0–5)
pH: 5.5 (ref 5.0–8.0)

## 2019-11-29 LAB — NO CULTURE INDICATED

## 2019-11-29 LAB — VITAMIN D 1,25 DIHYDROXY
Vitamin D 1, 25 (OH)2 Total: 45 pg/mL (ref 18–72)
Vitamin D2 1, 25 (OH)2: 33 pg/mL
Vitamin D3 1, 25 (OH)2: 12 pg/mL

## 2020-02-27 ENCOUNTER — Telehealth: Payer: Medicaid Other | Admitting: Nurse Practitioner

## 2020-02-28 ENCOUNTER — Telehealth: Payer: Medicaid Other | Admitting: Nurse Practitioner

## 2020-05-14 ENCOUNTER — Encounter: Payer: Self-pay | Admitting: Nurse Practitioner

## 2020-06-05 ENCOUNTER — Encounter (HOSPITAL_COMMUNITY): Payer: Self-pay

## 2020-06-05 ENCOUNTER — Ambulatory Visit (HOSPITAL_COMMUNITY)
Admission: EM | Admit: 2020-06-05 | Discharge: 2020-06-05 | Disposition: A | Discharge status: Home / Self Care | Payer: HRSA Program | Attending: Emergency Medicine | Admitting: Emergency Medicine

## 2020-06-05 DIAGNOSIS — Z79899 Other long term (current) drug therapy: Secondary | ICD-10-CM | POA: Diagnosis not present

## 2020-06-05 DIAGNOSIS — K219 Gastro-esophageal reflux disease without esophagitis: Secondary | ICD-10-CM | POA: Diagnosis not present

## 2020-06-05 DIAGNOSIS — R112 Nausea with vomiting, unspecified: Secondary | ICD-10-CM | POA: Diagnosis not present

## 2020-06-05 DIAGNOSIS — R1013 Epigastric pain: Secondary | ICD-10-CM

## 2020-06-05 DIAGNOSIS — R197 Diarrhea, unspecified: Secondary | ICD-10-CM | POA: Insufficient documentation

## 2020-06-05 DIAGNOSIS — Z20822 Contact with and (suspected) exposure to covid-19: Secondary | ICD-10-CM | POA: Insufficient documentation

## 2020-06-05 DIAGNOSIS — R109 Unspecified abdominal pain: Secondary | ICD-10-CM | POA: Insufficient documentation

## 2020-06-05 LAB — SARS CORONAVIRUS 2 (TAT 6-24 HRS): SARS Coronavirus 2: NEGATIVE

## 2020-06-05 MED ORDER — OMEPRAZOLE 20 MG PO CPDR: 20.0000 mg | DELAYED_RELEASE_CAPSULE | Freq: Every day | ORAL | 0 refills | Status: DC

## 2020-06-05 MED ORDER — ONDANSETRON 4 MG PO TBDP
4.0000 mg | ORAL_TABLET | Freq: Once | ORAL | Status: AC
  Administered 2020-06-05: 4 mg via ORAL

## 2020-06-05 MED ORDER — ONDANSETRON 4 MG PO TBDP: 4.0000 mg | ORAL_TABLET | Freq: Three times a day (TID) | ORAL | 0 refills | Status: DC | PRN

## 2020-06-05 NOTE — ED Provider Notes (Signed)
[facility]    CSN: [id number] Arrival date & time: 06/05/20  6295      History   Chief Complaint Chief Complaint  Patient presents with  . Abdominal Pain  . Diarrhea  . Generalized Body Aches    HPI Alyssa Forbes is a 30 y.o. female.   Alyssa Forbes presents with complaints of nausea, vomiting and diarrhea which started 1/3. Other coworkers with the same. Diarrhea subsided two days ago. Some dry heaving yesterday but vomiting has decreased. Hasn't been able to eat solids due to symptoms. Epigastric pain started after vomiting. She is taking liquids. Normal urination. No fevers. Headache which resolved yesterday. No URI symptoms. Has been vaccinated for covid-19.     ROS per HPI, negative if not otherwise mentioned.       Past Medical History:  Diagnosis Date  . Anemia   . Anxiety   . Back pain   . Depression     Patient Active Problem List   Diagnosis Date Noted  . Chronic midline low back pain without sciatica 11/28/2019  . Migraines 07/14/2019  . Anxiety 06/12/2019  . Depression 06/12/2019  . GERD (gastroesophageal reflux disease) 06/12/2019  . H/O seasonal allergies 06/12/2019  . Vitamin D deficiency 04/01/2019  . Adjustment insomnia 03/29/2019  . Morbid obesity with BMI of 40.0-44.9, adult (HCC) 08/16/2017    Past Surgical History:  Procedure Laterality Date  . TONSILLECTOMY    . TONSILLECTOMY  2009    OB History   No obstetric history on file.      Home Medications    Prior to Admission medications   Medication Sig Start Date End Date Taking? Authorizing Provider  omeprazole (PRILOSEC) 20 MG capsule Take 1 capsule (20 mg total) by mouth daily. 06/05/20  Yes Rishon Thilges, Dorene Grebe B, NP  ondansetron (ZOFRAN-ODT) 4 MG disintegrating tablet Take 1 tablet (4 mg total) by mouth every 8 (eight) hours as needed for nausea or vomiting. 06/05/20  Yes Linus Mako B, NP  acetaminophen (TYLENOL) 500 MG tablet Take 500 mg by mouth every 6 (six) hours  as needed.    [provider]  escitalopram (LEXAPRO) 10 MG tablet Take 1 tablet (10 mg total) by mouth daily. 07/12/19   Nche, Bonna Gains, NP  Norethin Ace-Eth Estrad-FE (AUROVELA FE 1/20 PO) Take by mouth.    [provider]  rizatriptan (MAXALT) 10 MG tablet Take 1 tablet (10 mg total) by mouth as needed for migraine. No more than 30mg  in 24hrs 11/28/19   Nche, 01/29/20, NP  senna-docusate (SENOKOT-S) 8.6-50 MG tablet Take 1 tablet by mouth at bedtime. 03/29/19   Nche, 03/31/19, NP  temazepam (RESTORIL) 7.5 MG capsule Take 1 capsule (7.5 mg total) by mouth at bedtime as needed for sleep. Patient not taking: No sig reported 07/12/19   Nche, 09/09/19, NP  Vitamin D, Ergocalciferol, (DRISDOL) 1.25 MG (50000 UNIT) CAPS capsule Take 1 capsule (50,000 Units total) by mouth every 7 (seven) days. 06/12/19   Nche, 06/14/19, NP    Family History Family History  Problem Relation Age of Onset  . Hypertension Mother   . Epilepsy Mother   . Depression Mother   . Hyperlipidemia Father   . Depression Father   . Heart disease Maternal Grandmother   . Stroke Maternal Grandfather   . Diabetes Paternal Grandmother   . Dementia Paternal Grandfather     Social History Social History   Tobacco Use  . Smoking status: Never Smoker  .  Smokeless tobacco: Never Used  Vaping Use  . Vaping Use: Never used  Substance Use Topics  . Alcohol use: Not Currently  . Drug use: No     Allergies   Nasonex [mometasone] and Rhinocort [budesonide]   Review of Systems Review of Systems   Physical Exam Triage Vital Signs ED Triage Vitals  Enc Vitals Group     BP 06/05/20 1116 (!) 131/94     Pulse Rate 06/05/20 1116 75     Resp 06/05/20 1116 18     Temp 06/05/20 1116 98.1 F (36.7 C)     Temp Source 06/05/20 1116 Oral     SpO2 06/05/20 1116 98 %     Weight --      Height --      Head Circumference --      Peak Flow --      Pain Score 06/05/20 1115 6     Pain Loc  --      Pain Edu? --      Excl. in [place]? --    No data found.  Updated Vital Signs BP (!) 131/94 (BP Location: Right Arm)   Pulse 75   Temp 98.1 F (36.7 C) (Oral)   Resp 18   SpO2 98%   Visual Acuity Right Eye Distance:   Left Eye Distance:   Bilateral Distance:    Right Eye Near:   Left Eye Near:    Bilateral Near:     Physical Exam Constitutional:      General: She is not in acute distress.    Appearance: She is well-developed.  Cardiovascular:     Rate and Rhythm: Normal rate.  Pulmonary:     Effort: Pulmonary effort is normal.  Abdominal:     Tenderness: There is abdominal tenderness in the epigastric area.     Comments: Mild epigastric pain on palpation   Skin:    General: Skin is warm and dry.  Neurological:     Mental Status: She is alert and oriented to person, place, and time.      UC Treatments / Results  Labs (all labs ordered are listed, but only abnormal results are displayed) Labs Reviewed  SARS CORONAVIRUS 2 (TAT 6-24 HRS)    EKG   Radiology No results found.  Procedures Procedures (including critical care time)  Medications Ordered in UC Medications  ondansetron (ZOFRAN-ODT) disintegrating tablet 4 mg (has no administration in time range)    Initial Impression / Assessment and Plan / UC Course  I have reviewed the triage vital signs and the nursing notes.  Pertinent labs & imaging results that were available during my care of the patient were reviewed by me and considered in my medical decision making (see chart for details).     Coworkers with similar. Symptoms are improving. Taking liquids. Vitals stable. History and physical consistent with viral illness.  Zofran, fluids, omeprazole. Covid testing pending and isolation instructions provided as well. Return precautions provided. Patient verbalized understanding and agreeable to plan.   Final Clinical Impressions(s) / UC Diagnoses   Final diagnoses:  Nausea vomiting and  diarrhea  Abdominal pain, epigastric     Discharge Instructions     Self isolate until covid results are back.  We will notify you by phone if it is positive. Your negative results will be sent through your MyChart.    If it is positive you need to isolate from others for a total of 5 days. If no fever for  24 hours without medications, and symptoms improving you may end isolation on day 6, but wear a mask if around any others for an additional 5 days.   Small frequent sips of fluids- Pedialyte, Gatorade, water, broth- to maintain hydration.   Zofran every 8 hours as needed for nausea or vomiting.   Omeprazole daily may help with the upper abdominal pain.  Please return for any worsening of symptoms.      ED Prescriptions    Medication Sig Dispense Auth. Provider   ondansetron (ZOFRAN-ODT) 4 MG disintegrating tablet Take 1 tablet (4 mg total) by mouth every 8 (eight) hours as needed for nausea or vomiting. 12 tablet Linus Mako B, NP   omeprazole (PRILOSEC) 20 MG capsule Take 1 capsule (20 mg total) by mouth daily. 30 capsule Georgetta Haber, NP     PDMP not reviewed this encounter.   Georgetta Haber, NP 06/05/20 1154

## 2020-06-05 NOTE — Discharge Instructions (Addendum)
Self isolate until covid results are back.  We will notify you by phone if it is positive. Your negative results will be sent through your MyChart.    If it is positive you need to isolate from others for a total of 5 days. If no fever for 24 hours without medications, and symptoms improving you may end isolation on day 6, but wear a mask if around any others for an additional 5 days.   Small frequent sips of fluids- Pedialyte, Gatorade, water, broth- to maintain hydration.   Zofran every 8 hours as needed for nausea or vomiting.   Omeprazole daily may help with the upper abdominal pain.  Please return for any worsening of symptoms.

## 2020-06-05 NOTE — ED Triage Notes (Signed)
Pt presents with abdominal pain, diarrhea, chills  and body aches x 4 days.

## 2020-11-29 ENCOUNTER — Encounter (HOSPITAL_COMMUNITY): Payer: Self-pay | Admitting: *Deleted

## 2020-11-29 ENCOUNTER — Ambulatory Visit (HOSPITAL_COMMUNITY)
Admission: EM | Admit: 2020-11-29 | Discharge: 2020-11-29 | Disposition: A | Discharge status: Home / Self Care | Payer: 59 | Attending: Physician Assistant | Admitting: Physician Assistant

## 2020-11-29 ENCOUNTER — Other Ambulatory Visit: Payer: Self-pay

## 2020-11-29 DIAGNOSIS — J029 Acute pharyngitis, unspecified: Secondary | ICD-10-CM | POA: Diagnosis not present

## 2020-11-29 LAB — POCT RAPID STREP A, ED / UC: Streptococcus, Group A Screen (Direct): NEGATIVE

## 2020-11-29 LAB — POCT INFECTIOUS MONO SCREEN, ED / UC: Mono Screen: NEGATIVE

## 2020-11-29 MED ORDER — AMOXICILLIN 500 MG PO CAPS: 500.0000 mg | ORAL_CAPSULE | Freq: Two times a day (BID) | ORAL | 0 refills | Status: DC

## 2020-11-29 NOTE — ED Provider Notes (Signed)
[facility]    CSN: [id number] Arrival date & time: 11/29/20  1532      History   Chief Complaint Chief Complaint  Patient presents with   Sore Throat    HPI Alyssa Forbes is a 30 y.o. female.   Patient presents today with a 2-day history of severe sore throat.  Reports sore throat pain is rated 8 on a 0-10 pain scale, localized to posterior oropharynx but worse on the left, described as sharp, worse with swallowing, no alleviating factors identified.  She has taken 2 at home COVID test that were negative.  She did have her tonsils removed when she was younger but has had strep despite this intervention and states current symptoms are similar to previous episodes of this condition.  She denies any known sick contacts.  Denies any recent antibiotic use.  She is up-to-date on influenza and COVID-19 vaccinations.  She is able to eat and drink normally despite symptoms.  Denies any muffled voice, dysphagia, shortness of breath, cough, nausea, vomiting, body aches.   Past Medical History:  Diagnosis Date   Anemia    Anxiety    Back pain    Depression     Patient Active Problem List   Diagnosis Date Noted   Chronic midline low back pain without sciatica 11/28/2019   Migraines 07/14/2019   Anxiety 06/12/2019   Depression 06/12/2019   GERD (gastroesophageal reflux disease) 06/12/2019   H/O seasonal allergies 06/12/2019   Vitamin D deficiency 04/01/2019   Adjustment insomnia 03/29/2019   Morbid obesity with BMI of 40.0-44.9, adult (HCC) 08/16/2017    Past Surgical History:  Procedure Laterality Date   TONSILLECTOMY     TONSILLECTOMY  2009    OB History   No obstetric history on file.      Home Medications    Prior to Admission medications   Medication Sig Start Date End Date Taking? Authorizing Provider  acetaminophen (TYLENOL) 500 MG tablet Take 500 mg by mouth every 6 (six) hours as needed.    [provider]  amoxicillin (AMOXIL) 500 MG  capsule Take 1 capsule (500 mg total) by mouth 2 (two) times daily. 11/29/20   Makaiyah Schweiger K, PA-C  escitalopram (LEXAPRO) 10 MG tablet Take 1 tablet (10 mg total) by mouth daily. 07/12/19   Nche, Bonna Gains, NP  Norethin Ace-Eth Estrad-FE (AUROVELA FE 1/20 PO) Take by mouth.    [provider]  omeprazole (PRILOSEC) 20 MG capsule Take 1 capsule (20 mg total) by mouth daily. 06/05/20   Georgetta Haber, NP  ondansetron (ZOFRAN-ODT) 4 MG disintegrating tablet Take 1 tablet (4 mg total) by mouth every 8 (eight) hours as needed for nausea or vomiting. 06/05/20   Georgetta Haber, NP  rizatriptan (MAXALT) 10 MG tablet Take 1 tablet (10 mg total) by mouth as needed for migraine. No more than 30mg  in 24hrs 11/28/19   Nche, 01/29/20, NP  senna-docusate (SENOKOT-S) 8.6-50 MG tablet Take 1 tablet by mouth at bedtime. 03/29/19   Nche, 03/31/19, NP  temazepam (RESTORIL) 7.5 MG capsule Take 1 capsule (7.5 mg total) by mouth at bedtime as needed for sleep. Patient not taking: No sig reported 07/12/19   Nche, 09/09/19, NP  Vitamin D, Ergocalciferol, (DRISDOL) 1.25 MG (50000 UNIT) CAPS capsule Take 1 capsule (50,000 Units total) by mouth every 7 (seven) days. 06/12/19   Nche, 06/14/19, NP    Family History Family History  Problem Relation Age of Onset  Hypertension Mother    Epilepsy Mother    Depression Mother    Hyperlipidemia Father    Depression Father    Heart disease Maternal Grandmother    Stroke Maternal Grandfather    Diabetes Paternal Grandmother    Dementia Paternal Grandfather     Social History Social History   Tobacco Use   Smoking status: Never   Smokeless tobacco: Never  Vaping Use   Vaping Use: Never used  Substance Use Topics   Alcohol use: Not Currently   Drug use: No     Allergies   Nasonex [mometasone] and Rhinocort [budesonide]   Review of Systems Review of Systems  Constitutional:  Negative for activity change, appetite change, fatigue and  fever.  HENT:  Positive for sore throat and trouble swallowing. Negative for congestion, sinus pressure and sneezing.   Respiratory:  Negative for cough and shortness of breath.   Cardiovascular:  Negative for chest pain.  Gastrointestinal:  Negative for abdominal pain, diarrhea, nausea and vomiting.  Musculoskeletal:  Negative for arthralgias and myalgias.  Neurological:  Negative for dizziness, light-headedness and headaches.    Physical Exam Triage Vital Signs ED Triage Vitals  Enc Vitals Group     BP 11/29/20 1557 117/87     Pulse Rate 11/29/20 1557 (!) 102     Resp 11/29/20 1557 20     Temp 11/29/20 1557 98.7 F (37.1 C)     Temp src --      SpO2 11/29/20 1557 97 %     Weight --      Height --      Head Circumference --      Peak Flow --      Pain Score 11/29/20 1553 5     Pain Loc --      Pain Edu? --      Excl. in GC? --    No data found.  Updated Vital Signs BP 117/87   Pulse (!) 102   Temp 98.7 F (37.1 C)   Resp 20   LMP 11/15/2020   SpO2 97%   Visual Acuity Right Eye Distance:   Left Eye Distance:   Bilateral Distance:    Right Eye Near:   Left Eye Near:    Bilateral Near:     Physical Exam Vitals reviewed.  Constitutional:      General: She is awake. She is not in acute distress.    Appearance: Normal appearance. She is normal weight. She is not ill-appearing.     Comments: Very pleasant female appears stated age in no acute distress  HENT:     Head: Normocephalic and atraumatic.     Right Ear: Tympanic membrane, ear canal and external ear normal. Tympanic membrane is not erythematous or bulging.     Left Ear: Tympanic membrane, ear canal and external ear normal. Tympanic membrane is not erythematous or bulging.     Nose:     Right Sinus: No maxillary sinus tenderness or frontal sinus tenderness.     Left Sinus: No maxillary sinus tenderness or frontal sinus tenderness.     Mouth/Throat:     Pharynx: Uvula midline. Oropharyngeal exudate and  posterior oropharyngeal erythema present.     Tonsils: 0 on the right. 0 on the left.     Comments: Petechial spots noted hard palate. Cardiovascular:     Rate and Rhythm: Normal rate and regular rhythm.     Heart sounds: Normal heart sounds, S1 normal and S2 normal. No  murmur heard. Pulmonary:     Effort: Pulmonary effort is normal.     Breath sounds: Normal breath sounds. No wheezing, rhonchi or rales.     Comments: Clear to auscultation bilaterally Lymphadenopathy:     Head:     Right side of head: No submental, submandibular or tonsillar adenopathy.     Left side of head: No submental, submandibular or tonsillar adenopathy.     Cervical: No cervical adenopathy.  Psychiatric:        Behavior: Behavior is cooperative.     UC Treatments / Results  Labs (all labs ordered are listed, but only abnormal results are displayed) Labs Reviewed  POCT RAPID STREP A, ED / UC  POCT INFECTIOUS MONO SCREEN, ED / UC    EKG   Radiology No results found.  Procedures Procedures (including critical care time)  Medications Ordered in UC Medications - No data to display  Initial Impression / Assessment and Plan / UC Course  I have reviewed the triage vital signs and the nursing notes.  Pertinent labs & imaging results that were available during my care of the patient were reviewed by me and considered in my medical decision making (see chart for details).     Mono was negative in clinic today.  Centor score of 2, rapid strep negative in clinic today.  Throat culture pending.  Given clinical presentation patient was empirically treated with amoxicillin.  She was instructed that antibiotics can decrease effectiveness of birth control just to the best of birth control while on this medication.  Recommended to use conservative treatment including Tylenol and ibuprofen as well as warm salt water gargles for symptom relief.  Discussed alarm symptoms that warrant emergent evaluation.  Strict  return precautions given to which patient expressed understanding.  Final Clinical Impressions(s) / UC Diagnoses   Final diagnoses:  Pharyngitis, unspecified etiology  Sore throat     Discharge Instructions      Your monotest was negative.  Your strep was also negative in clinic but given your clinical presentation we are going to treat you for strep with amoxicillin.  Antibiotics can decrease the effectiveness of birth control so please use backup birth control while taking this medication.  We will send this off for culture and if this comes back negative you can stop the medication.  Gargle with warm salt water and use Tylenol and ibuprofen for pain relief.  If you have any changes to your voice, difficulty swallowing, inability to eat/drink due to symptoms, shortness of breath you need to go to the emergency room.     ED Prescriptions     Medication Sig Dispense Auth. Provider   amoxicillin (AMOXIL) 500 MG capsule  (Status: Discontinued) Take 1 capsule (500 mg total) by mouth 2 (two) times daily. 20 capsule Alven Alverio K, PA-C   amoxicillin (AMOXIL) 500 MG capsule Take 1 capsule (500 mg total) by mouth 2 (two) times daily. 20 capsule Bryannah Boston, Noberto Retort, PA-C      PDMP not reviewed this encounter.   Jeani Hawking, PA-C 11/29/20 1718

## 2020-11-29 NOTE — ED Triage Notes (Signed)
Pt reports Sore throat since Friday . Pt reports having a Neg home COVID test . Pt reports a Hx of strep.

## 2020-11-29 NOTE — Discharge Instructions (Addendum)
Your monotest was negative.  Your strep was also negative in clinic but given your clinical presentation we are going to treat you for strep with amoxicillin.  Antibiotics can decrease the effectiveness of birth control so please use backup birth control while taking this medication.  We will send this off for culture and if this comes back negative you can stop the medication.  Gargle with warm salt water and use Tylenol and ibuprofen for pain relief.  If you have any changes to your voice, difficulty swallowing, inability to eat/drink due to symptoms, shortness of breath you need to go to the emergency room.

## 2020-12-02 LAB — CULTURE, GROUP A STREP (THRC)

## 2020-12-31 ENCOUNTER — Telehealth (INDEPENDENT_AMBULATORY_CARE_PROVIDER_SITE_OTHER): Payer: 59 | Admitting: Nurse Practitioner

## 2020-12-31 ENCOUNTER — Encounter: Payer: Self-pay | Admitting: Nurse Practitioner

## 2020-12-31 DIAGNOSIS — F3341 Major depressive disorder, recurrent, in partial remission: Secondary | ICD-10-CM

## 2020-12-31 MED ORDER — ESCITALOPRAM OXALATE 20 MG PO TABS: 20.0000 mg | ORAL_TABLET | Freq: Every day | ORAL | 1 refills | Status: DC

## 2020-12-31 NOTE — Assessment & Plan Note (Addendum)
Uncontrolled: Stable mood, but states it could be better. Occasionally struggles with anxiety due to work demands. Denies any SI or HI  Increase lexapro dose to 20mg  F/up in 18months Advised to maintain appts every 3-45months in order to continue getting medication refills

## 2020-12-31 NOTE — Progress Notes (Addendum)
Virtual Visit via Video Note  I connected withNAME@ on 01/04/21 at 11:00 AM EDT by a video enabled telemedicine application and verified that I am speaking with the correct person using two identifiers.  Location: Patient:Home Provider: Office Participants: patient and provider  I discussed the limitations of evaluation and management by telemedicine and the availability of in person appointments. I also discussed with the patient that there may be a patient responsible charge related to this service. The patient expressed understanding and agreed to proceed.  [id number] f/up  History of Present Illness: Depression Uncontrolled: Stable mood, but states it could be better. Occasionally struggles with anxiety due to work demands. Denies any SI or HI  Increase lexapro dose to 20mg  F/up in 62months Advised to maintain appts every 3-77months in order to continue getting medication refills  Depression screen [facility] 2/9 12/31/2020 11/26/2019 06/12/2019  Decreased Interest 0 0 0  Down, Depressed, Hopeless 0 0 1  PHQ - 2 Score 0 0 1  Altered sleeping 3 3 3   Tired, decreased energy 3 2 3   Change in appetite - 1 1  Feeling bad or failure about yourself  2 0 0  Trouble concentrating 2 0 0  Moving slowly or fidgety/restless 0 0 0  Suicidal thoughts 0 0 0  PHQ-9 Score 10 6 8   Difficult doing work/chores Somewhat difficult - -    GAD 7 : Generalized Anxiety Score 12/31/2020 11/26/2019 06/12/2019 03/29/2019  Nervous, Anxious, on Edge 2 0 3 3  Control/stop worrying 0 1 1 3   Worry too much - different things 0 1 1 3   Trouble relaxing 3 1 3 3   Restless 0 0 0 3  Easily annoyed or irritable 2 1 3 3   Afraid - awful might happen 0 0 0 2  Total GAD 7 Score 7 4 11 20   Anxiety Difficulty Somewhat difficult - - -   Observations/Objective: Physical Exam Vitals reviewed.  Pulmonary:     Effort: Pulmonary effort is normal.  Neurological:     Mental Status: She is oriented to person, place, and time.   Psychiatric:        Mood and Affect: Mood normal.        Behavior: Behavior normal.        Thought Content: Thought content normal.     Assessment and Plan: Alba was seen today for follow-up.  Diagnoses and all orders for this visit:  Recurrent major depressive disorder, in partial remission (HCC) -     escitalopram (LEXAPRO) 20 MG tablet; Take 1 tablet (20 mg total) by mouth daily.  Follow Up Instructions: See above   I discussed the assessment and treatment plan with the patient. The patient was provided an opportunity to ask questions and all were answered. The patient agreed with the plan and demonstrated an understanding of the instructions.   The patient was advised to call back or seek an in-person evaluation if the symptoms worsen or if the condition fails to improve as anticipated.  11/28/2019, NP

## 2021-01-20 ENCOUNTER — Telehealth: Payer: 59 | Admitting: Nurse Practitioner

## 2021-01-20 DIAGNOSIS — J069 Acute upper respiratory infection, unspecified: Secondary | ICD-10-CM

## 2021-01-20 MED ORDER — CHLORPHEN-PE-ACETAMINOPHEN 4-10-325 MG PO TABS: 1.0000 | ORAL_TABLET | Freq: Four times a day (QID) | ORAL | 0 refills | Status: DC | PRN

## 2021-01-20 MED ORDER — NAPROXEN 500 MG PO TABS: 500.0000 mg | ORAL_TABLET | Freq: Two times a day (BID) | ORAL | 0 refills | Status: DC

## 2021-01-20 NOTE — Progress Notes (Signed)
Virtual Visit Consent   Alyssa Forbes, you are scheduled for a virtual visit with Mary-Margaret Daphine Deutscher, FNP, a [facility] provider, today.     Just as with appointments in the office, your consent must be obtained to participate.  Your consent will be active for this visit and any virtual visit you may have with one of our providers in the next 365 days.     If you have a MyChart account, a copy of this consent can be sent to you electronically.  All virtual visits are billed to your insurance company just like a traditional visit in the office.    As this is a virtual visit, video technology does not allow for your provider to perform a traditional examination.  This may limit your provider's ability to fully assess your condition.  If your provider identifies any concerns that need to be evaluated in person or the need to arrange testing (such as labs, EKG, etc.), we will make arrangements to do so.     Although advances in technology are sophisticated, we cannot ensure that it will always work on either your end or our end.  If the connection with a video visit is poor, the visit may have to be switched to a telephone visit.  With either a video or telephone visit, we are not always able to ensure that we have a secure connection.     I need to obtain your verbal consent now.   Are you willing to proceed with your visit today? YES   Alyssa Forbes has provided verbal consent on 01/20/2021 for a virtual visit (video or telephone).   Mary-Margaret Daphine Deutscher, FNP   Date: 01/20/2021 12:24 PM   Virtual Visit via Video Note   I, Mary-Margaret Daphine Deutscher, connected with Alyssa Forbes ([id number], January 25, 1991) on 01/20/21 at 12:45 PM EDT by a video-enabled telemedicine application and verified that I am speaking with the correct person using two identifiers.  Location: Patient: Virtual Visit Location Patient: Home Provider: Virtual Visit Location Provider: Mobile   I discussed the limitations of  evaluation and management by telemedicine and the availability of in person appointments. The patient expressed understanding and agreed to proceed.    History of Present Illness: Alyssa Forbes is a 30 y.o. who identifies as a female who was assigned female at birth, and is being seen today for URI.  HPI: Ptient does video stating that she developed chills on Monday. Since congestion , stuffy nose and sneezing. Feels weak and dizzy. Covid test Monday and this morning were negative.  Review of Systems  Constitutional:  Positive for chills. Negative for fever.  HENT:  Positive for congestion. Negative for sinus pain and sore throat.   Respiratory:  Negative for cough, sputum production and shortness of breath.   Musculoskeletal:  Positive for myalgias.  Neurological:  Positive for dizziness and headaches.  All other systems reviewed and are negative.  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   Morbid obesity with BMI of 40.0-44.9, adult (HCC) 08/16/2017    Allergies:  Allergies  Allergen Reactions   Nasonex [Mometasone] Other (See Comments)    Break out/dry patches   Rhinocort [Budesonide]     Break  out/dry patches   Medications:  Current Outpatient Medications:    acetaminophen (TYLENOL) 500 MG tablet, Take 500 mg by mouth every 6 (six) hours as needed., Disp: , Rfl:    escitalopram (LEXAPRO) 20 MG tablet, Take 1 tablet (20 mg total) by mouth daily., Disp: 90 tablet, Rfl: 1   ondansetron (ZOFRAN-ODT) 4 MG disintegrating tablet, Take 1 tablet (4 mg total) by mouth every 8 (eight) hours as needed for nausea or vomiting., Disp: 12 tablet, Rfl: 0   Vitamin D, Ergocalciferol,  (DRISDOL) 1.25 MG (50000 UNIT) CAPS capsule, Take 1 capsule (50,000 Units total) by mouth every 7 (seven) days. (Patient not taking: Reported on 12/31/2020), Disp: 8 capsule, Rfl: 0  Observations/Objective: Patient is well-developed, well-nourished in no acute distress.  Resting comfortably  at home.  Head is normocephalic, atraumatic.  No labored breathing.  Speech is clear and coherent with logical content.  Patient is alert and oriented at baseline.  Voice hoarse No cough oted  Assessment and Plan:  Alyssa Forbes in today with chief complaint of URI   1. Viral URI 1. Take meds as prescribed 2. Use a cool mist humidifier especially during the winter months and when heat has been humid. 3. Use saline nose sprays frequently 4. Saline irrigations of the nose can be very helpful if done frequently.  * 4X daily for 1 week*  * Use of a nettie pot can be helpful with this. Follow directions with this* 5. Drink plenty of fluids 6. Keep thermostat turn down low 7.For any cough or congestion  Use plain Mucinex- regular strength or max strength is fine   * Children- consult with Pharmacist for dosing 8. For fever or aces or pains- take tylenol or ibuprofen appropriate for age and weight.  * for fevers greater than 101 orally you may alternate ibuprofen and tylenol every  3 hours.   Meds ordered this encounter  Medications   Chlorphen-PE-Acetaminophen 4-10-325 MG TABS    Sig: Take 1 tablet by mouth every 6 (six) hours as needed.    Dispense:  20 tablet    Refill:  0    Order Specific Question:   Supervising Provider    Answer:   MILLER, BRIAN [[id number]]   naproxen (NAPROSYN) 500 MG tablet    Sig: Take 1 tablet (500 mg total) by mouth 2 (two) times daily with a meal.    Dispense:  30 tablet    Refill:  0    Order Specific Question:   Supervising Provider    Answer:   Eber Hong [[id number]]      Follow Up Instructions: I discussed the assessment and treatment plan with the patient.  The patient was provided an opportunity to ask questions and all were answered. The patient agreed with the plan and demonstrated an understanding of the instructions.  A copy of instructions were sent to the patient via MyChart.  The patient was advised to call back or seek an in-person evaluation if the symptoms worsen or if the condition fails to improve as anticipated.  Time:  I spent 10 minutes with the patient via telehealth technology discussing the above problems/concerns.    Mary-Margaret Daphine Deutscher, FNP

## 2021-02-08 DIAGNOSIS — Z113 Encounter for screening for infections with a predominantly sexual mode of transmission: Secondary | ICD-10-CM | POA: Diagnosis not present

## 2021-02-08 DIAGNOSIS — N760 Acute vaginitis: Secondary | ICD-10-CM | POA: Diagnosis not present

## 2021-02-08 DIAGNOSIS — Z114 Encounter for screening for human immunodeficiency virus [HIV]: Secondary | ICD-10-CM | POA: Diagnosis not present

## 2021-02-08 DIAGNOSIS — N946 Dysmenorrhea, unspecified: Secondary | ICD-10-CM | POA: Diagnosis not present

## 2021-02-08 DIAGNOSIS — Z23 Encounter for immunization: Secondary | ICD-10-CM | POA: Diagnosis not present

## 2021-02-10 ENCOUNTER — Other Ambulatory Visit: Payer: Self-pay | Admitting: Nurse Practitioner

## 2021-02-10 DIAGNOSIS — N852 Hypertrophy of uterus: Secondary | ICD-10-CM

## 2021-02-24 ENCOUNTER — Ambulatory Visit
Admission: RE | Admit: 2021-02-24 | Discharge: 2021-02-24 | Disposition: A | Discharge status: Home / Self Care | Payer: Medicaid Other | Source: Ambulatory Visit | Attending: Nurse Practitioner | Admitting: Nurse Practitioner

## 2021-02-24 DIAGNOSIS — N852 Hypertrophy of uterus: Secondary | ICD-10-CM

## 2021-03-04 ENCOUNTER — Other Ambulatory Visit: Payer: 59

## 2021-03-04 ENCOUNTER — Ambulatory Visit (HOSPITAL_COMMUNITY)
Admission: EM | Admit: 2021-03-04 | Discharge: 2021-03-04 | Disposition: A | Discharge status: Home / Self Care | Payer: Medicaid Other | Attending: Emergency Medicine | Admitting: Emergency Medicine

## 2021-03-04 ENCOUNTER — Encounter (HOSPITAL_COMMUNITY): Payer: Self-pay

## 2021-03-04 ENCOUNTER — Other Ambulatory Visit: Payer: Self-pay

## 2021-03-04 DIAGNOSIS — N946 Dysmenorrhea, unspecified: Secondary | ICD-10-CM

## 2021-03-04 DIAGNOSIS — R102 Pelvic and perineal pain: Secondary | ICD-10-CM

## 2021-03-04 MED ORDER — KETOROLAC TROMETHAMINE 30 MG/ML IJ SOLN
INTRAMUSCULAR | Status: AC
  Filled 2021-03-04: qty 1

## 2021-03-04 MED ORDER — KETOROLAC TROMETHAMINE 30 MG/ML IJ SOLN
30.0000 mg | Freq: Once | INTRAMUSCULAR | Status: AC
  Administered 2021-03-04: 30 mg via INTRAMUSCULAR

## 2021-03-04 NOTE — Discharge Instructions (Signed)
You can take Tylenol and/or Ibuprofen as needed for pain relief and fever reduction.   You can use lidocaine patches or heating pads as needed for comfort.   Follow up with [facility] for re-evaluation of cervical cyst.

## 2021-03-04 NOTE — ED Provider Notes (Signed)
[facility]    CSN: [id number] Arrival date & time: 03/04/21  1046      History   Chief Complaint Chief Complaint  Patient presents with   Abdominal Pain    HPI Alyssa Forbes is a 30 y.o. female.   Patient here for evaluation of left lower quadrant pain that has been worsening for the past week.  Reports that she has had dysmenorrhea in the past and was evaluated in June.  Reports that she had not had a period since June until last week.  She was evaluated at [facility] and had a negative pregnancy test but they did send her for ultrasound for further evaluation of symptoms.  Ultrasound showed nabothian cyst on her cervix but was otherwise negative.  Reports Toradol shots have worked for menstrual cramping in the past.  Denies any trauma, injury, or other precipitating event.  Denies any specific alleviating or aggravating factors.  Denies any fevers, chest pain, shortness of breath, N/V/D, numbness, tingling, weakness, or headaches.    The history is provided by the patient.  Abdominal Pain  Past Medical History:  Diagnosis Date   Anemia    Anxiety    Back pain    Depression     Patient Active Problem List   Diagnosis Date Noted   Chronic midline low back pain without sciatica 11/28/2019   Migraines 07/14/2019   Anxiety 06/12/2019   Depression 06/12/2019   GERD (gastroesophageal reflux disease) 06/12/2019   H/O seasonal allergies 06/12/2019   Vitamin D deficiency 04/01/2019   Adjustment insomnia 03/29/2019   Morbid obesity with BMI of 40.0-44.9, adult (HCC) 08/16/2017    Past Surgical History:  Procedure Laterality Date   TONSILLECTOMY     TONSILLECTOMY  2009    OB History   No obstetric history on file.      Home Medications    Prior to Admission medications   Medication Sig Start Date End Date Taking? Authorizing Provider  acetaminophen (TYLENOL) 500 MG tablet Take 500 mg by mouth every 6 (six) hours as needed.    [provider]  Chlorphen-PE-Acetaminophen 4-10-325 MG TABS Take 1 tablet by mouth every 6 (six) hours as needed. 01/20/21   Daphine Deutscher, Mary-Margaret, FNP  escitalopram (LEXAPRO) 20 MG tablet Take 1 tablet (20 mg total) by mouth daily. 12/31/20   Nche, Bonna Gains, NP  naproxen (NAPROSYN) 500 MG tablet Take 1 tablet (500 mg total) by mouth 2 (two) times daily with a meal. 01/20/21   Daphine Deutscher, Mary-Margaret, FNP  ondansetron (ZOFRAN-ODT) 4 MG disintegrating tablet Take 1 tablet (4 mg total) by mouth every 8 (eight) hours as needed for nausea or vomiting. 06/05/20   Georgetta Haber, NP  Vitamin D, Ergocalciferol, (DRISDOL) 1.25 MG (50000 UNIT) CAPS capsule Take 1 capsule (50,000 Units total) by mouth every 7 (seven) days. Patient not taking: Reported on 12/31/2020 06/12/19   Nche, Bonna Gains, NP    Family History Family History  Problem Relation Age of Onset   Hypertension Mother    Epilepsy Mother    Depression Mother    Hyperlipidemia Father    Depression Father    Heart disease Maternal Grandmother    Stroke Maternal Grandfather    Diabetes Paternal Grandmother    Dementia Paternal Grandfather     Social History Social History   Tobacco Use   Smoking status: Never   Smokeless tobacco: Never  Vaping Use   Vaping Use: Never used  Substance Use Topics  Alcohol use: Not Currently   Drug use: No     Allergies   Nasonex [mometasone] and Rhinocort [budesonide]   Review of Systems Review of Systems  Gastrointestinal:  Positive for abdominal pain.  Genitourinary:  Positive for menstrual problem and pelvic pain.  All other systems reviewed and are negative.   Physical Exam Triage Vital Signs ED Triage Vitals  Enc Vitals Group     BP 03/04/21 1111 111/82     Pulse Rate 03/04/21 1111 77     Resp 03/04/21 1111 18     Temp 03/04/21 1111 98.3 F (36.8 C)     Temp Source 03/04/21 1111 Oral     SpO2 03/04/21 1111 96 %     Weight --      Height --      Head Circumference --       Peak Flow --      Pain Score 03/04/21 1110 7     Pain Loc --      Pain Edu? --      Excl. in GC? --    No data found.  Updated Vital Signs BP 111/82 (BP Location: Right Arm)   Pulse 77   Temp 98.3 F (36.8 C) (Oral)   Resp 18   SpO2 96%   Visual Acuity Right Eye Distance:   Left Eye Distance:   Bilateral Distance:    Right Eye Near:   Left Eye Near:    Bilateral Near:     Physical Exam Vitals and nursing note reviewed.  Constitutional:      General: She is not in acute distress.    Appearance: Normal appearance. She is not ill-appearing, toxic-appearing or diaphoretic.  HENT:     Head: Normocephalic and atraumatic.  Eyes:     Conjunctiva/sclera: Conjunctivae normal.  Cardiovascular:     Rate and Rhythm: Normal rate.     Pulses: Normal pulses.  Pulmonary:     Effort: Pulmonary effort is normal.  Abdominal:     General: Abdomen is flat.     Palpations: Abdomen is soft.     Tenderness: There is abdominal tenderness in the suprapubic area.  Genitourinary:    Comments: declines Musculoskeletal:        General: Normal range of motion.     Cervical back: Normal range of motion.  Skin:    General: Skin is warm and dry.  Neurological:     General: No focal deficit present.     Mental Status: She is alert and oriented to person, place, and time.  Psychiatric:        Mood and Affect: Mood normal.     UC Treatments / Results  Labs (all labs ordered are listed, but only abnormal results are displayed) Labs Reviewed - No data to display  EKG   Radiology No results found.  Procedures Procedures (including critical care time)  Medications Ordered in UC Medications  ketorolac (TORADOL) 30 MG/ML injection 30 mg (has no administration in time range)    Initial Impression / Assessment and Plan / UC Course  I have reviewed the triage vital signs and the nursing notes.  Pertinent labs & imaging results that were available during my care of the patient were  reviewed by me and considered in my medical decision making (see chart for details).    Assessment negative for red flags or concerns.  Pelvic cramping and dysmenorrhea.  Toradol IM administered in office.  May use lidocaine patches or heating pads  for comfort.  Tylenol and or ibuprofen as needed.  [facility] further evaluation of nabothian cyst. Final Clinical Impressions(s) / UC Diagnoses   Final diagnoses:  Dysmenorrhea  Pelvic pain in female     Discharge Instructions      You can take Tylenol and/or Ibuprofen as needed for pain relief and fever reduction.   You can use lidocaine patches or heating pads as needed for comfort.   [facility] with [facility] for re-evaluation of cervical cyst.      ED Prescriptions   None    PDMP not reviewed this encounter.   Ivette Loyal, NP 03/04/21 [phone]

## 2021-03-04 NOTE — ED Triage Notes (Signed)
Pt presents with dysmenorrhea and LLQ abdominal pain cramping from cyst on her ovaries for over a week with it increasing since yesterday.

## 2021-05-14 ENCOUNTER — Telehealth: Payer: Self-pay | Admitting: Family

## 2021-05-14 DIAGNOSIS — R109 Unspecified abdominal pain: Secondary | ICD-10-CM

## 2021-05-14 DIAGNOSIS — M545 Low back pain, unspecified: Secondary | ICD-10-CM

## 2021-05-14 NOTE — Progress Notes (Signed)
Based on what you shared with me, I feel your condition warrants further evaluation and I recommend that you be seen in a face to face visit.  Given your current symptoms, you need to be seen in person to be evaluated and have further testing.    NOTE: There will be NO CHARGE for this eVisit   If you are having a true medical emergency please call 911.      For an urgent face to face visit, [place] has six urgent care centers for your convenience:     [facility] at [facility] Directions [phone] [address] [place], [place] [address]    [facility]) Get Driving Directions [phone] [address] [place], [place] [address]  [facility]) Get Driving Directions [phone] [address] [place],  [place]  [address]  [facility] Urgent Care at [facility] Get Driving Directions [phone] [address] [place], [place] [address]   [facility] Health Urgent Care at [facility] Get Driving Directions  [phone] [address] [place], [place] [address]   [facility] Care at [facility] Directions [phone] [address] [place], [place] [address]  Your MyChart E-visit questionnaire answers were reviewed by a board certified advanced clinical practitioner to complete your personal care plan based on your specific symptoms.  Thank you for using e-Visits.

## 2021-06-07 ENCOUNTER — Encounter: Payer: Self-pay | Admitting: Nurse Practitioner

## 2021-06-07 DIAGNOSIS — F3341 Major depressive disorder, recurrent, in partial remission: Secondary | ICD-10-CM

## 2021-06-07 MED ORDER — ESCITALOPRAM OXALATE 20 MG PO TABS: 20.0000 mg | ORAL_TABLET | Freq: Every day | ORAL | 0 refills | Status: DC

## 2021-09-15 DIAGNOSIS — Z3202 Encounter for pregnancy test, result negative: Secondary | ICD-10-CM | POA: Diagnosis not present

## 2021-09-15 DIAGNOSIS — Z113 Encounter for screening for infections with a predominantly sexual mode of transmission: Secondary | ICD-10-CM | POA: Diagnosis not present

## 2021-09-15 DIAGNOSIS — Z114 Encounter for screening for human immunodeficiency virus [HIV]: Secondary | ICD-10-CM | POA: Diagnosis not present

## 2021-09-15 DIAGNOSIS — Z30012 Encounter for prescription of emergency contraception: Secondary | ICD-10-CM | POA: Diagnosis not present

## 2021-09-15 DIAGNOSIS — Z30011 Encounter for initial prescription of contraceptive pills: Secondary | ICD-10-CM | POA: Diagnosis not present

## 2021-10-05 ENCOUNTER — Telehealth: Payer: Self-pay | Admitting: Physician Assistant

## 2021-10-05 DIAGNOSIS — J069 Acute upper respiratory infection, unspecified: Secondary | ICD-10-CM

## 2021-10-05 MED ORDER — BENZONATATE 100 MG PO CAPS: 100.0000 mg | ORAL_CAPSULE | Freq: Three times a day (TID) | ORAL | 0 refills | Status: DC | PRN

## 2021-10-05 NOTE — Progress Notes (Signed)
I have spent 5 minutes in review of e-visit questionnaire, review and updating patient chart, medical decision making and response to patient.   Harshith Pursell Cody Makiyla Linch, PA-C    

## 2021-10-05 NOTE — Progress Notes (Signed)

## 2021-10-07 ENCOUNTER — Encounter: Payer: Self-pay | Admitting: Family Medicine

## 2021-10-07 ENCOUNTER — Telehealth: Payer: Self-pay | Admitting: Family Medicine

## 2021-10-07 DIAGNOSIS — J019 Acute sinusitis, unspecified: Secondary | ICD-10-CM

## 2021-10-07 DIAGNOSIS — B9689 Other specified bacterial agents as the cause of diseases classified elsewhere: Secondary | ICD-10-CM

## 2021-10-07 MED ORDER — AMOXICILLIN-POT CLAVULANATE 875-125 MG PO TABS: 1.0000 | ORAL_TABLET | Freq: Two times a day (BID) | ORAL | 0 refills | Status: AC

## 2021-10-07 NOTE — Progress Notes (Signed)

## 2021-10-11 ENCOUNTER — Telehealth: Payer: Self-pay | Admitting: Physician Assistant

## 2021-10-11 DIAGNOSIS — J329 Chronic sinusitis, unspecified: Secondary | ICD-10-CM

## 2021-10-11 NOTE — Progress Notes (Signed)
Based on what you shared with me, I feel your condition warrants further evaluation and I recommend that you be seen in a face to face visit. Giving ongoing symptoms despite treatment via e-visit, you will need to be evaluated in person, either with your PCP or at an in-person urgent care to help reexamine and determine the next course of action.  ?  ?NOTE: There will be NO CHARGE for this eVisit ?  ?If you are having a true medical emergency please call 911.   ?  ? For an urgent face to face visit, [place] has six urgent care centers for your convenience:  ?  ? [place] [facility] [place] [facility] ?Get Driving Directions ?[phone] ?[address] ?[place], [place] [address] ?  ? [place] [facility]) ?Get Driving Directions ?[phone] ?[address] ?[place], [place] [address] ? ?[place] [facility] [place] [facility]) ?Get Driving Directions ?[place],  [place]  [address] ? ?[place] [facility] at [facility] ?Get Driving Directions ?[phone] ?[address] ?[place], [place] [address] ?  ?[place] [facility] [place] ?Get Driving Directions  ?[phone] ?[address] ?[place], [place] [address] ?  ? Urgent Care at [facility] ?Get Driving Directions ?[phone] ?[address] ?[place], [place] [address] ? ?Your MyChart E-visit questionnaire answers were reviewed by a board certified advanced clinical practitioner to complete your personal care plan based on your specific symptoms.  Thank you for using e-Visits. ?  ? ?

## 2021-11-11 ENCOUNTER — Encounter: Payer: Self-pay | Admitting: Family Medicine

## 2021-11-11 ENCOUNTER — Telehealth: Payer: Self-pay | Admitting: Family Medicine

## 2021-11-11 DIAGNOSIS — R052 Subacute cough: Secondary | ICD-10-CM

## 2021-11-11 MED ORDER — BENZONATATE 100 MG PO CAPS: 100.0000 mg | ORAL_CAPSULE | Freq: Three times a day (TID) | ORAL | 0 refills | Status: DC | PRN

## 2021-11-11 NOTE — Progress Notes (Signed)
We are sorry that you are not feeling well.  Here is how we plan to help!  Based on your presentation I believe you most likely have A cough due to a virus.  This is called viral bronchitis and is best treated by rest, plenty of fluids and control of the cough.  You may use Ibuprofen or Tylenol as directed to help your symptoms.     In addition you may use A prescription cough medication called Tessalon Perles 100mg. You may take 1-2 capsules every 8 hours as needed for your cough.    From your responses in the eVisit questionnaire you describe inflammation in the upper respiratory tract which is causing a significant cough.  This is commonly called Bronchitis and has four common causes:   Allergies Viral Infections Acid Reflux Bacterial Infection Allergies, viruses and acid reflux are treated by controlling symptoms or eliminating the cause. An example might be a cough caused by taking certain blood pressure medications. You stop the cough by changing the medication. Another example might be a cough caused by acid reflux. Controlling the reflux helps control the cough.  USE OF BRONCHODILATOR ("RESCUE") INHALERS: There is a risk from using your bronchodilator too frequently.  The risk is that over-reliance on a medication which only relaxes the muscles surrounding the breathing tubes can reduce the effectiveness of medications prescribed to reduce swelling and congestion of the tubes themselves.  Although you feel brief relief from the bronchodilator inhaler, your asthma may actually be worsening with the tubes becoming more swollen and filled with mucus.  This can delay other crucial treatments, such as oral steroid medications. If you need to use a bronchodilator inhaler daily, several times per day, you should discuss this with your provider.  There are probably better treatments that could be used to keep your asthma under control.     HOME CARE Only take medications as instructed by your  medical team. Complete the entire course of an antibiotic. Drink plenty of fluids and get plenty of rest. Avoid close contacts especially the very young and the elderly Cover your mouth if you cough or cough into your sleeve. Always remember to wash your hands A steam or ultrasonic humidifier can help congestion.   GET HELP RIGHT AWAY IF: You develop worsening fever. You become short of breath You cough up blood. Your symptoms persist after you have completed your treatment plan MAKE SURE YOU  Understand these instructions. Will watch your condition. Will get help right away if you are not doing well or get worse.    Thank you for choosing an e-visit.  Your e-visit answers were reviewed by a board certified advanced clinical practitioner to complete your personal care plan. Depending upon the condition, your plan could have included both over the counter or prescription medications.  Please review your pharmacy choice. Make sure the pharmacy is open so you can pick up prescription now. If there is a problem, you may contact your provider through MyChart messaging and have the prescription routed to another pharmacy.  Your safety is important to us. If you have drug allergies check your prescription carefully.   For the next 24 hours you can use MyChart to ask questions about today's visit, request a non-urgent call back, or ask for a work or school excuse. You will get an email in the next two days asking about your experience. I hope that your e-visit has been valuable and will speed your recovery.]  I provided 5   minutes of non face-to-face time during this encounter for chart review, medication and order placement, as well as and documentation.   

## 2021-12-20 ENCOUNTER — Telehealth: Payer: Self-pay | Admitting: Nurse Practitioner

## 2021-12-20 DIAGNOSIS — M545 Low back pain, unspecified: Secondary | ICD-10-CM

## 2021-12-20 MED ORDER — CYCLOBENZAPRINE HCL 5 MG PO TABS: 5.0000 mg | ORAL_TABLET | Freq: Three times a day (TID) | ORAL | 1 refills | Status: AC | PRN

## 2021-12-20 MED ORDER — NAPROXEN 500 MG PO TABS: 500.0000 mg | ORAL_TABLET | Freq: Two times a day (BID) | ORAL | 0 refills | Status: DC

## 2021-12-20 NOTE — Progress Notes (Signed)
We are sorry that you are not feeling well.  Here is how we plan to help!  Based on what you have shared with me it looks like you mostly have acute back pain.  Acute back pain is defined as musculoskeletal pain that can resolve in 1-3 weeks with conservative treatment.  I have prescribed Naprosyn 500 mg take one by mouth twice a day non-steroid anti-inflammatory (NSAID). You should not use any other NSAID medication while using Naprosyn, avoid ibuprofen, advil, aleve and motrin. You can use tylenol for breakthrough pain.   We will also prescribe Flexeril 5 mg every eight hours as needed which is a muscle relaxer.  Some patients experience stomach irritation or in increased heartburn with anti-inflammatory drugs.  Please keep in mind that muscle relaxer's can cause fatigue and should not be taken while at work or driving.  Back pain is very common.  The pain often gets better over time.  The cause of back pain is usually not dangerous.  Most people can learn to manage their back pain on their own.  Home Care Stay active.  Start with short walks on flat ground if you can.  Try to walk farther each day. Do not sit, drive or stand in one place for more than 30 minutes.  Do not stay in bed. Do not avoid exercise or work.  Activity can help your back heal faster. Be careful when you bend or lift an object.  Bend at your knees, keep the object close to you, and do not twist. Sleep on a firm mattress.  Lie on your side, and bend your knees.  If you lie on your back, put a pillow under your knees. Only take medicines as told by your doctor. Put ice on the injured area. Put ice in a plastic bag Place a towel between your skin and the bag Leave the ice on for 15-20 minutes, 3-4 times a day for the first 2-3 days. 210 After that, you can switch between ice and heat packs. Ask your doctor about back exercises or massage. Avoid feeling anxious or stressed.  Find good ways to deal with stress, such as  exercise.  Get Help Right Way If: Your pain does not go away with rest or medicine. Your pain does not go away in 1 week. You have new problems. You do not feel well. The pain spreads into your legs. You cannot control when you poop (bowel movement) or pee (urinate) You feel sick to your stomach (nauseous) or throw up (vomit) You have belly (abdominal) pain. You feel like you may pass out (faint). If you develop a fever.  Make Sure you: Understand these instructions. Will watch your condition Will get help right away if you are not doing well or get worse.  Your e-visit answers were reviewed by a board certified advanced clinical practitioner to complete your personal care plan.  Depending on the condition, your plan could have included both over the counter or prescription medications.  If there is a problem please reply  once you have received a response from your provider.  Your safety is important to [place].  If you have drug allergies check your prescription carefully.    You can use MyChart to ask questions about today's visit, request a non-urgent call back, or ask for a work or school excuse for 24 hours related to this e-Visit. If it has been greater than 24 hours you will need to follow up with your provider, or  enter a new e-Visit to address those concerns.  You will get an e-mail in the next two days asking about your experience.  I hope that your e-visit has been valuable and will speed your recovery. Thank you for using e-visits.   I spent approximately 7 minutes reviewing the patient's history, current symptoms and coordinating their plan of care today.    Meds ordered this encounter  Medications   naproxen (NAPROSYN) 500 MG tablet    Sig: Take 1 tablet (500 mg total) by mouth 2 (two) times daily with a meal.    Dispense:  30 tablet    Refill:  0   cyclobenzaprine (FLEXERIL) 5 MG tablet    Sig: Take 1 tablet (5 mg total) by mouth 3 (three) times daily as needed for  muscle spasms.    Dispense:  30 tablet    Refill:  1

## 2022-03-10 ENCOUNTER — Ambulatory Visit: Payer: Commercial Managed Care - HMO | Admitting: Podiatry

## 2022-03-10 ENCOUNTER — Ambulatory Visit (INDEPENDENT_AMBULATORY_CARE_PROVIDER_SITE_OTHER): Payer: Commercial Managed Care - HMO

## 2022-03-10 ENCOUNTER — Encounter: Payer: Self-pay | Admitting: Podiatry

## 2022-03-10 DIAGNOSIS — M722 Plantar fascial fibromatosis: Secondary | ICD-10-CM

## 2022-03-11 ENCOUNTER — Telehealth: Payer: Self-pay | Admitting: Urology

## 2022-03-11 NOTE — Telephone Encounter (Signed)
DOS - 03/22/22  EPF LEFT --- 63893  [employer] EFFECTIVE DATE - 01/28/22  PER [employer]'S AUTPMATIVE SYSTEM FOR CPT CODE 73428 NO PRIOR AUTH IS REQUIRED.  REF # [id number]

## 2022-03-13 NOTE — Progress Notes (Signed)
Subjective:   Patient ID: Alyssa Forbes, female   DOB: 31 y.o.   MRN: [id number]   HPI Patient presents with a severe pain in both heels its been lasting for around a year.  She did have extensive conservative care with previous injection therapy immobilization insert therapy anti-inflammatories without relief of symptoms.  This pain has reached a point where she is compensating developing other pains its worse plantarly in the heel region moderate obesity noted.  Patient does not smoke likes to be active   Review of Systems  All other systems reviewed and are negative.       Objective:  Physical Exam Vitals and nursing note reviewed.  Constitutional:      Appearance: She is well-developed.  Pulmonary:     Effort: Pulmonary effort is normal.  Musculoskeletal:        General: Normal range of motion.  Skin:    General: Skin is warm.  Neurological:     Mental Status: She is alert.     Neurovascular status found to be intact muscle strength was found to be adequate range of motion adequate with patient noted to have exquisite discomfort plantar aspect heel region medial band left over right mild discomfort within the arch forefoot and around the Achilles which is most likely compensatory for chronic heel pain bilateral that is not responded to any form of conservative care with patient gaining weight due to inactivity     Assessment:  Chronic Planter fasciitis bilateral failure to respond conservatively over extended time     Plan:  H&P reviewed condition at great length and I do think at this point surgical intervention is indicated.  I spent a great deal time going over surgery with her and her mother explaining procedure risk and patient wants surgery understanding risk and signed consent form after extensive review.  Patient should understands total recovery can take 6 months and she can develop some other pains due to release of the fascia but hopefully over time this will give  her relief of these severe symptoms.  She is fitted properly for air fracture walker today with all instructions given and I would like her to wear it prior to surgery to get used to it prior to when we do this procedure  X-rays do indicate large spur formation within the heel bilateral and moderate depression of the arch

## 2022-03-21 MED ORDER — HYDROCODONE-ACETAMINOPHEN 10-325 MG PO TABS: 1.0000 | ORAL_TABLET | Freq: Three times a day (TID) | ORAL | 0 refills | Status: AC | PRN

## 2022-03-21 NOTE — Addendum Note (Signed)
Addended by: Wallene Huh on: 03/21/2022 03:21 PM   Modules accepted: Orders

## 2022-03-22 ENCOUNTER — Encounter: Payer: Self-pay | Admitting: Podiatry

## 2022-03-22 DIAGNOSIS — M722 Plantar fascial fibromatosis: Secondary | ICD-10-CM | POA: Diagnosis not present

## 2022-03-23 ENCOUNTER — Telehealth: Payer: Self-pay | Admitting: Podiatry

## 2022-03-23 ENCOUNTER — Telehealth: Payer: Self-pay | Admitting: *Deleted

## 2022-03-23 NOTE — Telephone Encounter (Signed)
Mom stated daughter Alyssa Forbes is still in a lot of pain, OTC meds are not working. She requested a Rx for pain be called in if possible. She uses the [facility] pharmacy at [place]. Please advise.

## 2022-03-23 NOTE — Telephone Encounter (Signed)
Patient is calling to request pain medicine, had surgery one day ago. Please advise.

## 2022-03-23 NOTE — Telephone Encounter (Signed)
notified

## 2022-03-23 NOTE — Telephone Encounter (Signed)
I sent in a prescription on Monday. Should be at pharmacy

## 2022-03-23 NOTE — Telephone Encounter (Signed)
Dr. Paulla Dolly sent over patients pain medication to pharmacy on Monday 03/21/22. It is listed in the patients med list. Patient is aware.

## 2022-03-28 ENCOUNTER — Ambulatory Visit (INDEPENDENT_AMBULATORY_CARE_PROVIDER_SITE_OTHER): Payer: Commercial Managed Care - HMO

## 2022-03-28 ENCOUNTER — Telehealth: Payer: Self-pay

## 2022-03-28 DIAGNOSIS — M722 Plantar fascial fibromatosis: Secondary | ICD-10-CM

## 2022-03-28 NOTE — Progress Notes (Signed)
Patient in office for POV #1 DOS 03/22/2022 EPF LT.  Patient denies nausea, vomiting, fever and chills. Patient reports boot is a little heavy and is having a hard time walking with a walker at home. Patient states Rx pain medication is helpful with pain control at this time.   Sterile dressing applied at this time. Patient will return to office in 2 weeks for suture removal. Order for knee scooter given.   Advised patient to call the office with any signs or symptoms of infection. Patient verbalized understanding.

## 2022-03-28 NOTE — Telephone Encounter (Signed)
Order for knee scooter placed with [facility]

## 2022-04-05 IMAGING — US US PELVIS COMPLETE WITH TRANSVAGINAL
2 series · 13 of 25 positions shown · non-contrast
Comparison: None

CLINICAL DATA: Enlarged uterus on exam, dysmenorrhea,, recently
missed menses, negative pregnancy test; family history of uterine
fibroids; LMP 12/16/2020

EXAM:
TRANSABDOMINAL AND TRANSVAGINAL ULTRASOUND OF PELVIS
TECHNIQUE: Both transabdominal and transvaginal ultrasound examinations of the
pelvis were performed. Transabdominal technique was performed for
global imaging of the pelvis including uterus, ovaries, adnexal
regions, and pelvic cul-de-sac. It was necessary to proceed with
endovaginal exam following the transabdominal exam to visualize the
endometrium and ovaries.

[Series 1: us pelvis complete with transvaginal · 0.22mm/px · 12 of 42 slices shown (1 of 2)]
[im 1/42]
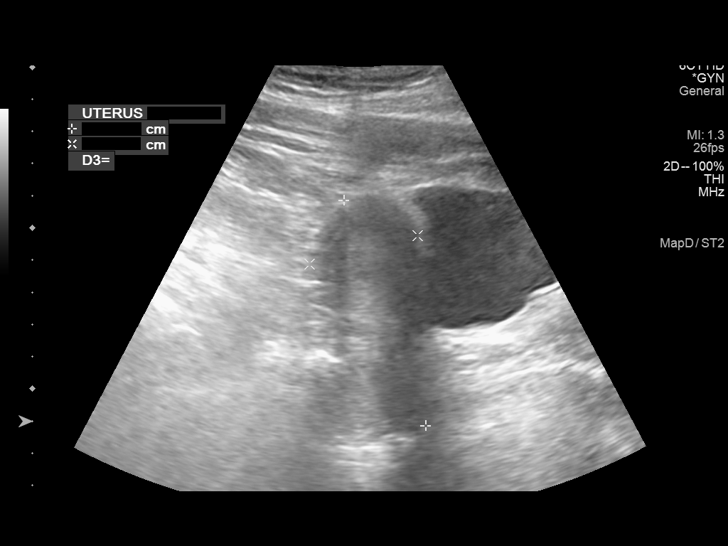
[im 4/42]
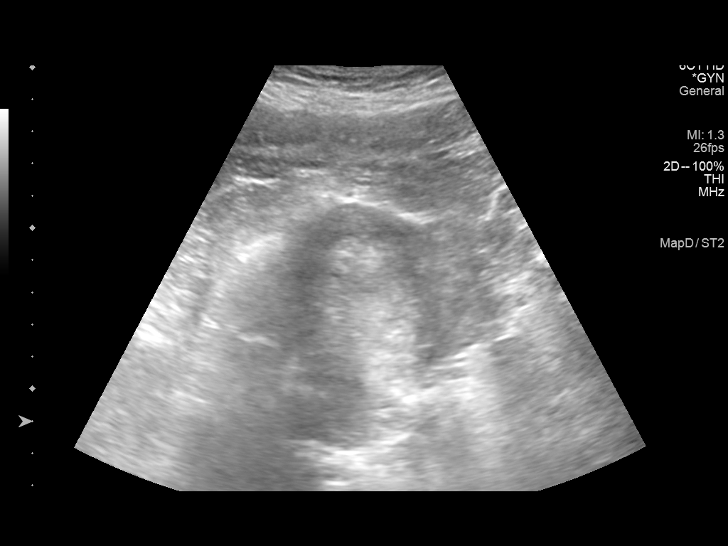
[im 8/42]
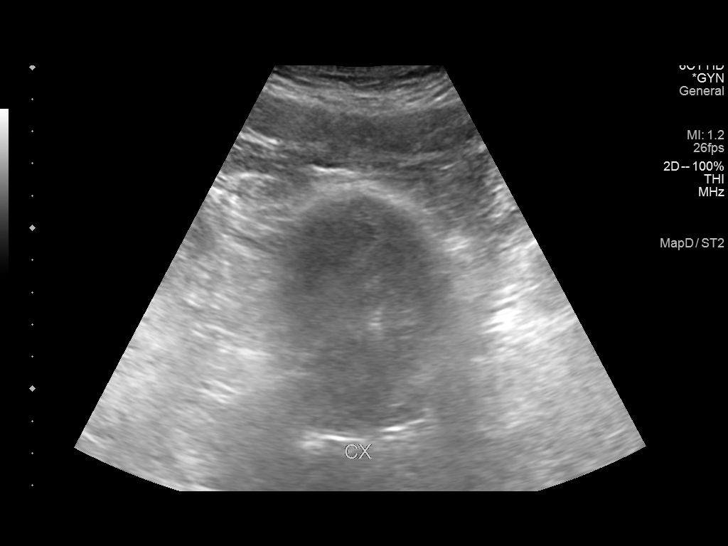
[im 11/42]
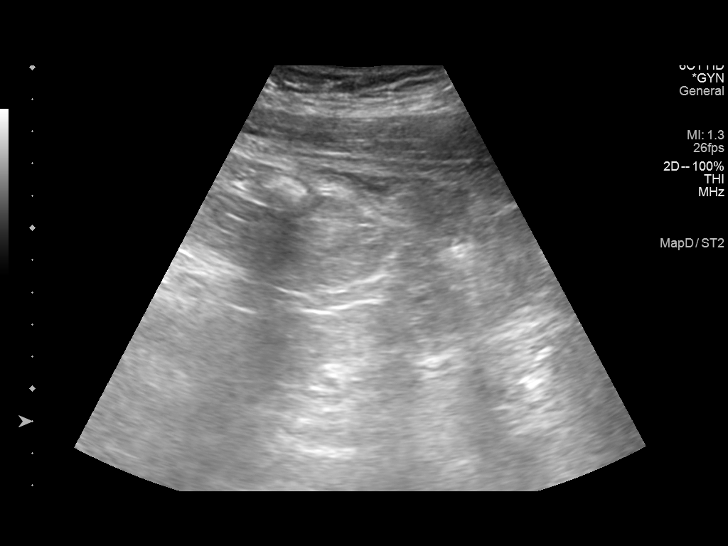
[im 15/42]
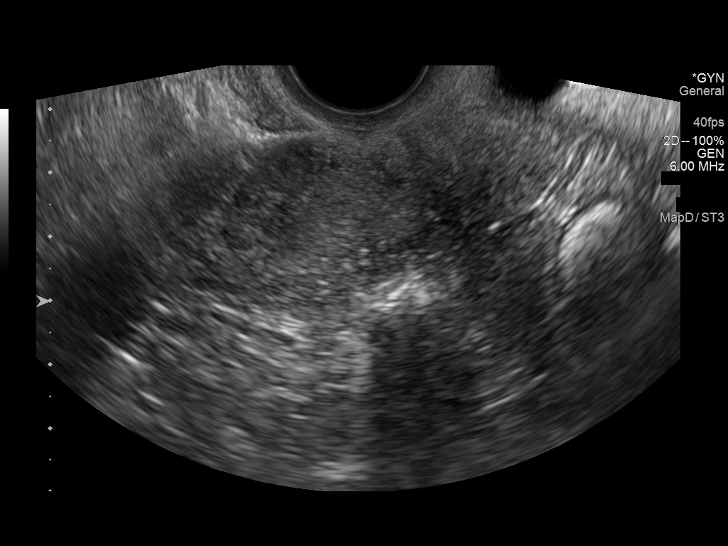
[im 18/42]
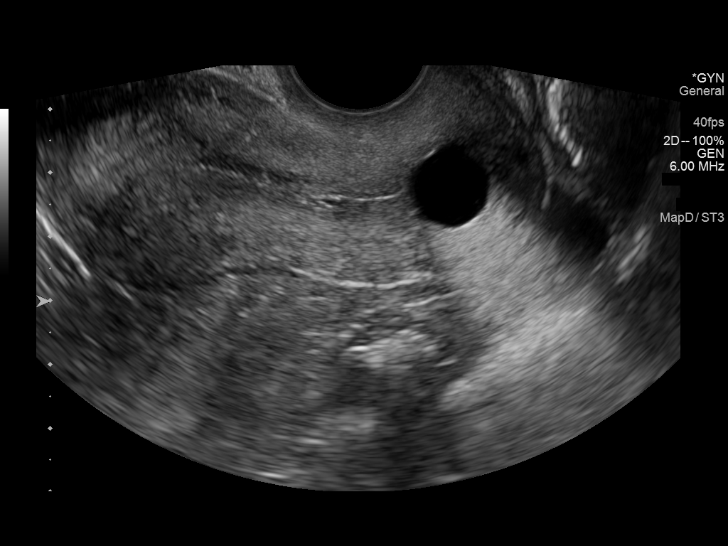
[im 22/42]
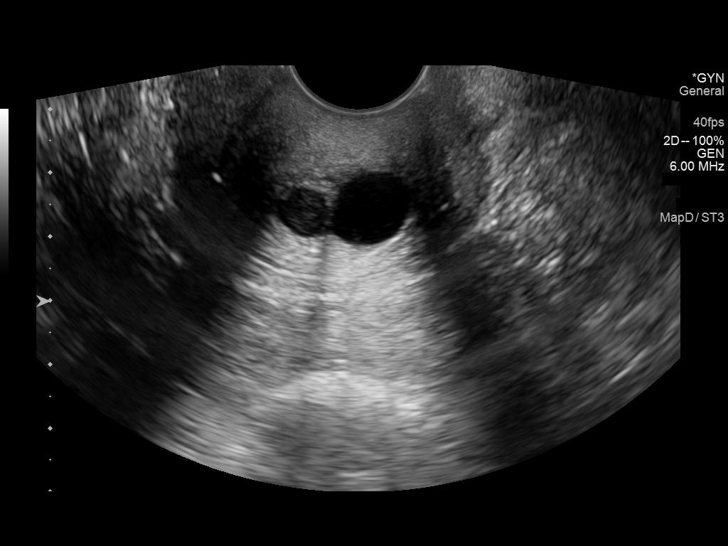
[im 25/42]
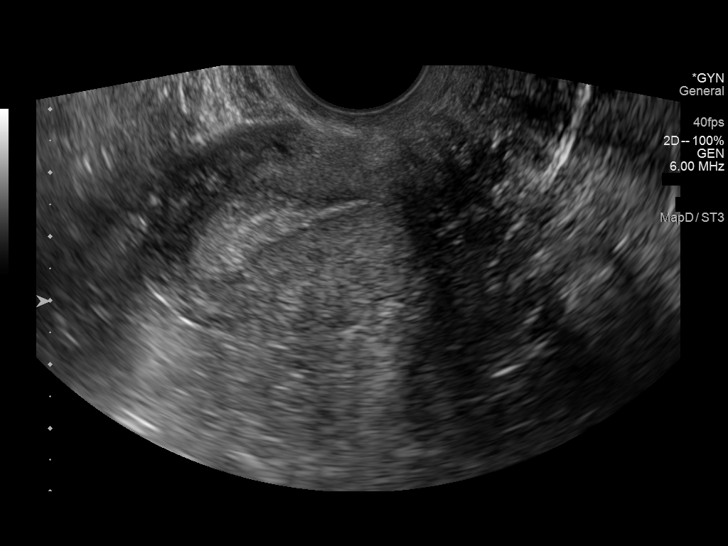
[im 29/42]
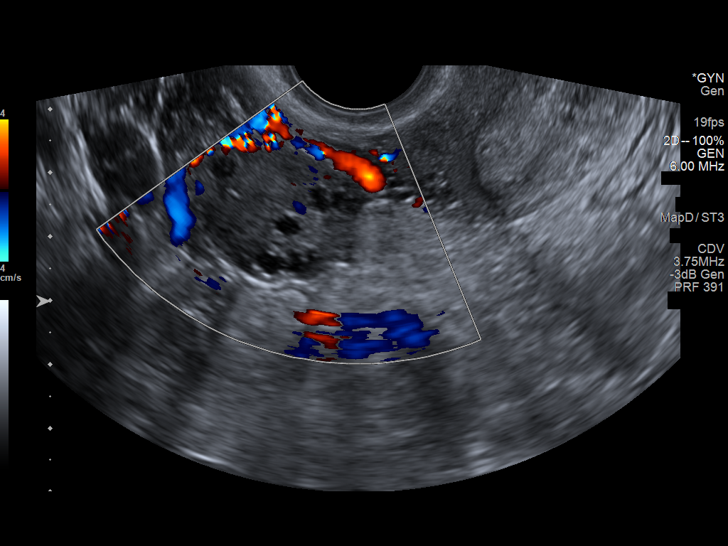
[im 33/42]
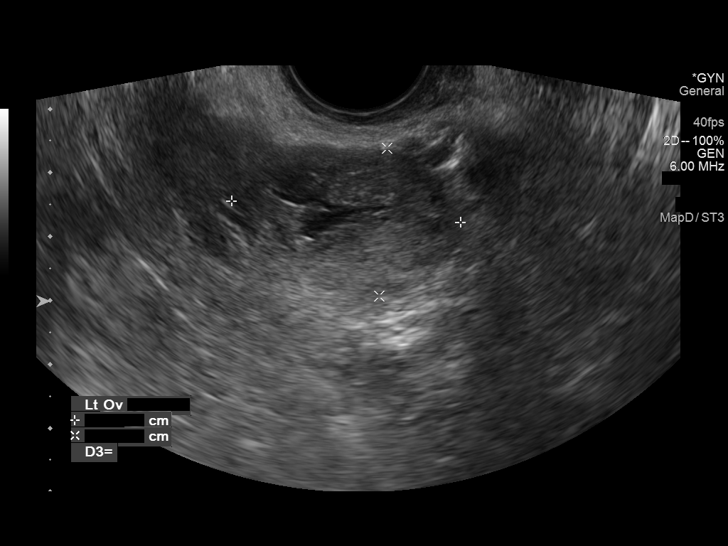
[im 36/42]
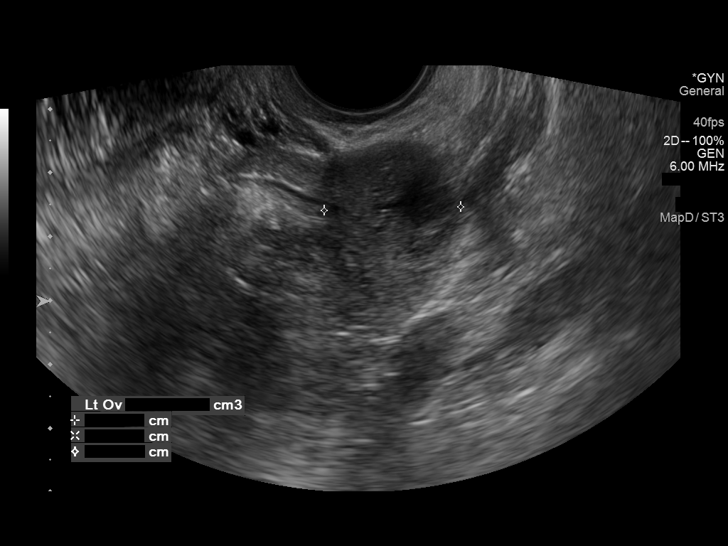
[im 40/42]
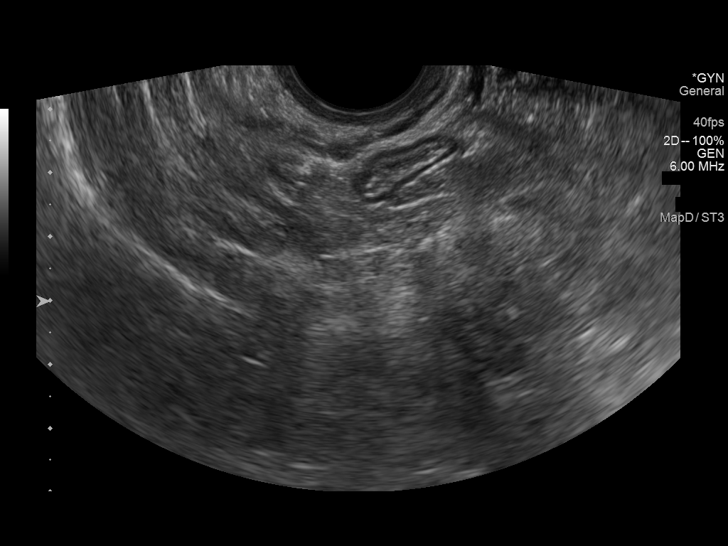

[Series 1001: us pelvis complete with transvaginal · 0.11mm/px · 1 of 1 slices shown (2 of 2)]
[im 1/1]
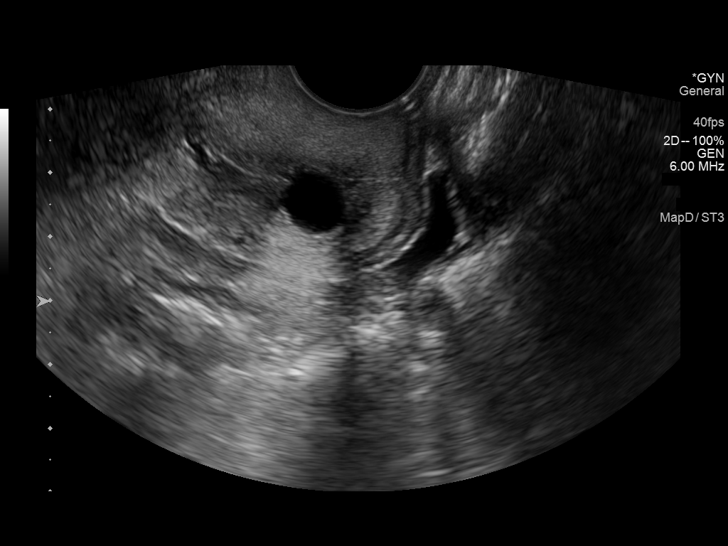

[13 of 25 positions shown; findings below may reference images not displayed]

FINDINGS: Uterus

Measurements: 7.4 x 3.5 x 4.7 cm = volume: 64 mL. Anteverted. Normal
morphology without mass. Incidentally noted nabothian cysts at
cervix.

Endometrium

Thickness: 10 mm.  No endometrial fluid or mass

Right ovary

Measurements: 2.4 x 2.4 x 1.8 cm = volume: 5.3 mL. Normal morphology
without mass

Left ovary

Measurements: 3.6 x 2.3 x 2.1 cm = volume: 9.4 mL. Normal morphology
with small resolving corpus luteum; no follow-up imaging
recommended. No additional mass

Other findings

Trace free pelvic fluid potentially physiologic.  No adnexal masses.
IMPRESSION: No significant pelvic sonographic abnormalities.

## 2022-04-13 ENCOUNTER — Ambulatory Visit (INDEPENDENT_AMBULATORY_CARE_PROVIDER_SITE_OTHER): Payer: Commercial Managed Care - HMO | Admitting: Podiatry

## 2022-04-13 DIAGNOSIS — M722 Plantar fascial fibromatosis: Secondary | ICD-10-CM | POA: Diagnosis not present

## 2022-04-13 MED ORDER — DICLOFENAC SODIUM 75 MG PO TBEC: 75.0000 mg | DELAYED_RELEASE_TABLET | Freq: Two times a day (BID) | ORAL | 0 refills | Status: AC

## 2022-04-13 NOTE — Patient Instructions (Signed)

## 2022-04-13 NOTE — Progress Notes (Signed)
POV #2 DOS 03/22/2022 EPF LT  Patient denies any nausea, vomiting, fever, and chills.   Sutures were removed today without any complications.   Anklet was provided today to patient to continue to use. Advise patient that she can gradually start to walk in the boot and slowly work into supportive athletic type of shoe. Advised patient to continue to ice and stretch.   Advised patient to call the office with any signs or symptoms of infection. Patient verbalized understanding.

## 2022-11-10 ENCOUNTER — Ambulatory Visit: Payer: Commercial Managed Care - HMO | Admitting: Podiatry

## 2022-11-16 ENCOUNTER — Ambulatory Visit (INDEPENDENT_AMBULATORY_CARE_PROVIDER_SITE_OTHER): Payer: Commercial Managed Care - HMO

## 2022-11-16 ENCOUNTER — Ambulatory Visit (INDEPENDENT_AMBULATORY_CARE_PROVIDER_SITE_OTHER): Payer: Commercial Managed Care - HMO | Admitting: Podiatry

## 2022-11-16 ENCOUNTER — Other Ambulatory Visit: Payer: Self-pay | Admitting: Podiatry

## 2022-11-16 DIAGNOSIS — R52 Pain, unspecified: Secondary | ICD-10-CM

## 2022-11-16 DIAGNOSIS — M722 Plantar fascial fibromatosis: Secondary | ICD-10-CM

## 2022-11-16 MED ORDER — DICLOFENAC SODIUM 75 MG PO TBEC: 75.0000 mg | DELAYED_RELEASE_TABLET | Freq: Two times a day (BID) | ORAL | 2 refills | Status: AC

## 2022-11-17 NOTE — Progress Notes (Signed)
Subjective:   Patient ID: Alyssa Forbes, female   DOB: 32 y.o.   MRN: [id number]   HPI Patient presents stating she has been getting cramps in her left arch and while her heel is better this is still been sore for her.  Patient is approximately 8 months after surgery   ROS      Objective:  Physical Exam  Neurovascular status intact negative Denna Haggard' sign noted wound edges are well coapted from previous surgery with moderate discomfort within the arch with the heel itself doing well with obesity is complicating factor for this patient     Assessment:  Appears to be more an irritation of the mid fascial band where there may have been some retraction and is frustrating for me that I did not know about this earlier     Plan:  Reviewed the condition and that we wish we knew earlier that she was having problems but she had not come in.  I went ahead today and I did apply a night splint which was fitted properly to her lower leg off-the-shelf padded to hold the foot in proper alignment.  I advised on heat ice therapy placed on diclofenac and good support and reappoint in 1 month  X-rays indicate large spur but no indications of arch reduction or other pathology

## 2022-12-14 ENCOUNTER — Ambulatory Visit: Payer: Commercial Managed Care - HMO | Admitting: Podiatry

## 2022-12-19 ENCOUNTER — Ambulatory Visit (INDEPENDENT_AMBULATORY_CARE_PROVIDER_SITE_OTHER): Payer: Commercial Managed Care - HMO | Admitting: Family Medicine

## 2022-12-19 ENCOUNTER — Other Ambulatory Visit: Payer: Self-pay

## 2022-12-19 VITALS — BP 118/86 | Ht 66.0 in | Wt 288.0 lb

## 2022-12-19 DIAGNOSIS — M79672 Pain in left foot: Secondary | ICD-10-CM

## 2022-12-19 DIAGNOSIS — G8929 Other chronic pain: Secondary | ICD-10-CM | POA: Diagnosis not present

## 2022-12-19 DIAGNOSIS — M25572 Pain in left ankle and joints of left foot: Secondary | ICD-10-CM

## 2022-12-19 DIAGNOSIS — M25562 Pain in left knee: Secondary | ICD-10-CM

## 2022-12-19 NOTE — Patient Instructions (Signed)
We will go ahead with an MRI of your ankle - you've been dealing with this over 1.5 years and not improving with conservative treatment and surgical treatment. Arch supports when up and walking around. Do home exercises and stretches as directed. Icing 15 minutes at a time as needed.

## 2022-12-20 ENCOUNTER — Encounter: Payer: Self-pay | Admitting: Family Medicine

## 2022-12-20 NOTE — Progress Notes (Signed)
PCP: Anne Ng, NP  Subjective:   HPI: Patient is a 32 y.o. female here for left ankle/foot pain.  Patient reports she's had pain in left foot dating back to October 2022. When pain started it felt like she was stepping on gravel with her left heel. Was told by one podiatrist she had a heel spur and recommended surgery - she declined. Had a second opinion and given 2-3 steroid injections for plantar fasciitis. Then this past October had a plantar fascia release of her left foot. Pain is worse now than it was back then. Pain throbs, difficult to go to sleep. Gets pain and numbness in medial, plantar foot. Has been icing and did physical therapy after this. Did not do therapy prior to surgical intervention or try orthotics. Also reports left knee popping back in 2020. Still has discomfort, feeling of instability anterior left knee.  Past Medical History:  Diagnosis Date   Anemia    Anxiety    Back pain    Depression     Current Outpatient Medications on File Prior to Visit  Medication Sig Dispense Refill   acetaminophen (TYLENOL) 500 MG tablet Take 500 mg by mouth every 6 (six) hours as needed.     cyclobenzaprine (FLEXERIL) 5 MG tablet Take 1 tablet (5 mg total) by mouth 3 (three) times daily as needed for muscle spasms. 30 tablet 1   diclofenac (VOLTAREN) 75 MG EC tablet Take 1 tablet (75 mg total) by mouth 2 (two) times daily. 30 tablet 0   diclofenac (VOLTAREN) 75 MG EC tablet Take 1 tablet (75 mg total) by mouth 2 (two) times daily. 50 tablet 2   Norethindrone Acetate-Ethinyl Estrad-FE (HAILEY 24 FE) 1-20 MG-MCG(24) tablet      No current facility-administered medications on file prior to visit.    Past Surgical History:  Procedure Laterality Date   TONSILLECTOMY     TONSILLECTOMY  2009    Allergies  Allergen Reactions   Nasonex [Mometasone] Other (See Comments)    Break out/dry patches   Rhinocort [Budesonide]     Break out/dry patches    BP 118/86    Ht 5\' 6"  (1.676 m)   Wt 288 lb (130.6 kg)   BMI 46.48 kg/m       No data to display              No data to display              Objective:  Physical Exam:  Gen: NAD, comfortable in exam room  Left foot/ankle: Well healed medial proximal foot scar over plantar fascia.  Mild swelling here and through plantar and medial foot.  No other gross deformity, swelling, ecchymoses FROM TTP throughout proximal plantar fascia, medial calcaneus. Negative ant drawer and negative talar tilt.   Negative syndesmotic compression. Pain with calcaneal squeeze. Thompsons test negative. NV intact distally.  Left knee: No gross deformity, ecchymoses, swelling.  Hypermobile patella. TTP post patellar facets. FROM with normal strength. Negative ant/post drawers. Negative valgus/varus testing. Negative lachman.  Negative mcmurrays, apleys.  NV intact distally.   Assessment & Plan:  1. Left foot pain - concerning she's had over 1.5 years of pain in heel, plantar fascia area.  She's done home exercises, physical therapy.  Had 2-3 injections into plantar fascia and plantar fascia release.  Question possible stress fracture, tibial nerve injury (numbness in medial and plantar foot as well).  Will proceed with MRI ankle to assess which will capture  calcaneus, plantar fascia, proximal foot.  Radiographs negative most recently on 11/16/22.  2. Left knee pain - consistent with patellofemoral syndrome.  Possible she subluxed or dislocated her patella with injury in 2020.  Would recommend conservative treatment.  Home exercises reviewed.  Brace also discussed.  Icing.  Consider formal physical therapy for this.

## 2023-01-02 NOTE — Addendum Note (Signed)
Addended by: Merrilyn Puma on: 01/02/2023 11:15 AM   Modules accepted: Orders

## 2023-01-03 ENCOUNTER — Other Ambulatory Visit: Payer: Commercial Managed Care - HMO

## 2023-02-21 ENCOUNTER — Encounter: Payer: Self-pay | Admitting: Family Medicine

## 2023-02-21 NOTE — Progress Notes (Signed)
Patient came in on 9/19 during her mom's appointment with an update.  Her MRI of the ankle was denied.  She's continued to do her home exercises for plantar fasciitis though continues with pain.  She's had x-rays of the heel, done extensive rehab and had 2-3 injections by outside provider for plantar fasciitis.  Suspect MRI was not approved because it was of the ankle - will proceed with MRI calcaneus to assess for calcaneal stress fracture or other reason she's not improved as expected.  Consider referral to foot/ankle surgeon depending on results.

## 2023-02-21 NOTE — Progress Notes (Signed)
Spoke with pt regarding her insurance. Initially her secondary policy denied her MRI. She states the [facility] is no longer active, she just has [place]. Rosann Auerbach has already approved her left ankle MRI for [facility]. Pt is aware and will call [facility] to schedule this.

## 2023-04-17 ENCOUNTER — Encounter: Payer: Self-pay | Admitting: Family Medicine

## 2023-04-17 ENCOUNTER — Ambulatory Visit (INDEPENDENT_AMBULATORY_CARE_PROVIDER_SITE_OTHER): Payer: Self-pay | Admitting: Family Medicine

## 2023-04-17 DIAGNOSIS — M722 Plantar fascial fibromatosis: Secondary | ICD-10-CM

## 2023-04-17 NOTE — Progress Notes (Signed)
Patient returns for shockwave therapy left plantar fasciitis.  Procedure: ECSWT Indications:  left plantar fasciitis   Procedure Details Consent: Risks of procedure as well as the alternatives and risks of each were explained to the patient.  Written consent for procedure obtained. Time Out: Verified patient identification, verified procedure, site was marked, verified correct patient position, medications/allergies/relevent history reviewed.  The area was cleaned with alcohol swab.     The left plantar fascia was targeted for Extracorporeal shockwave therapy.    Preset: plantar fasciitis Power Level: 80 Frequency: 14 Impulse/cycles: 2500 Head size: large   Patient tolerated procedure well without immediate complications.  Follow up in 1 week for second of four treatments.

## 2023-04-18 ENCOUNTER — Encounter: Payer: Self-pay | Admitting: Family Medicine

## 2023-04-18 MED ORDER — GABAPENTIN 300 MG PO CAPS: 300.0000 mg | ORAL_CAPSULE | Freq: Three times a day (TID) | ORAL | 1 refills | Status: AC

## 2023-04-24 ENCOUNTER — Ambulatory Visit (INDEPENDENT_AMBULATORY_CARE_PROVIDER_SITE_OTHER): Payer: Self-pay | Admitting: Family Medicine

## 2023-04-24 DIAGNOSIS — M722 Plantar fascial fibromatosis: Secondary | ICD-10-CM

## 2023-04-24 NOTE — Progress Notes (Signed)
Patient returns for second shockwave treatment for her left plantar fasciitis.  Had complete relief for an hour after first treatment but then pain was much worse after this.  Doing her stretches regularly.  Procedure: ECSWT Indications:  left plantar fasciitis   Procedure Details Consent: Risks of procedure as well as the alternatives and risks of each were explained to the patient.  Written consent for procedure obtained. Time Out: Verified patient identification, verified procedure, site was marked, verified correct patient position, medications/allergies/relevent history reviewed.  The area was cleaned with alcohol swab.     The left plantar fascia was targeted for Extracorporeal shockwave therapy.    Preset: plantar fasciitis Power Level: 70 Frequency: 14 Impulse/cycles: 2500 Head size: large   Patient tolerated procedure well without immediate complications.  Turned power down today due to pain after last session.  F/u in 1 week for third shockwave treatment.

## 2023-05-04 ENCOUNTER — Ambulatory Visit (INDEPENDENT_AMBULATORY_CARE_PROVIDER_SITE_OTHER): Payer: Self-pay | Admitting: Family Medicine

## 2023-05-04 DIAGNOSIS — M722 Plantar fascial fibromatosis: Secondary | ICD-10-CM

## 2023-05-04 NOTE — Progress Notes (Signed)
Patient returns for her third shockwave treatment. She reports excellent improvement from last treatment until that Saturday. No severe increase in pain from last treatment also.  Procedure: ECSWT Indications:  left plantar fasciitis   Procedure Details Consent: Risks of procedure as well as the alternatives and risks of each were explained to the patient.  Written consent for procedure obtained. Time Out: Verified patient identification, verified procedure, site was marked, verified correct patient position, medications/allergies/relevent history reviewed.  The area was cleaned with alcohol swab.     The left plantar fascia was targeted for Extracorporeal shockwave therapy.    Preset: plantar fasciitis Power Level: 80 Frequency: 14 Impulse/cycles: 2500 Head size: large   Patient tolerated procedure well without immediate complications

## 2023-05-11 ENCOUNTER — Ambulatory Visit: Payer: Commercial Managed Care - HMO | Admitting: Family Medicine

## 2023-05-11 ENCOUNTER — Encounter: Payer: Self-pay | Admitting: Family Medicine

## 2023-05-11 VITALS — Ht 66.0 in

## 2023-05-11 DIAGNOSIS — M722 Plantar fascial fibromatosis: Secondary | ICD-10-CM

## 2023-05-11 NOTE — Progress Notes (Signed)
Patient returns for fourth shockwave treatment.  She had more pain after last treatment at the higher power - requested going back to lower 70mJ.  Procedure: ECSWT Indications:  left plantar fasciitis   Procedure Details Consent: Risks of procedure as well as the alternatives and risks of each were explained to the patient.  Written consent for procedure obtained. Time Out: Verified patient identification, verified procedure, site was marked, verified correct patient position, medications/allergies/relevent history reviewed.  The area was cleaned with alcohol swab.     The left plantar fascia was targeted for Extracorporeal shockwave therapy.    Preset: plantar fasciitis Power Level: 70 Frequency: 14 Impulse/cycles: 2500 Head size: large   Patient tolerated procedure well without immediate complications.  Consider additional 1-2 treatments.

## 2024-01-11 ENCOUNTER — Telehealth: Admitting: Physician Assistant

## 2024-01-11 DIAGNOSIS — B9789 Other viral agents as the cause of diseases classified elsewhere: Secondary | ICD-10-CM

## 2024-01-11 DIAGNOSIS — J019 Acute sinusitis, unspecified: Secondary | ICD-10-CM | POA: Diagnosis not present

## 2024-01-11 NOTE — Progress Notes (Signed)
 I have spent 5 minutes in review of e-visit questionnaire, review and updating patient chart, medical decision making and response to patient.   Elsie Velma Lunger, PA-C

## 2024-01-11 NOTE — Progress Notes (Signed)
 E-Visit for Upper Respiratory Infection  ? ?We are sorry you are not feeling well.  Here is how we plan to help! ? ?Based on what you have shared with me, it looks like you may have a viral upper respiratory infection.  Upper respiratory infections are caused by a large number of viruses; however, rhinovirus is the most common cause.  ? ?Symptoms vary from person to person, with common symptoms including sore throat, cough, fatigue or lack of energy and feeling of general discomfort.  A low-grade fever of up to 100.4 may present, but is often uncommon.  Symptoms vary however, and are closely related to a person's age or underlying illnesses.  The most common symptoms associated with an upper respiratory infection are nasal discharge or congestion, cough, sneezing, headache and pressure in the ears and face.  These symptoms usually persist for about 3 to 10 days, but can last up to 2 weeks.  It is important to know that upper respiratory infections do not cause serious illness or complications in most cases.   ? ?Upper respiratory infections can be transmitted from person to person, with the most common method of transmission being a person's hands.  The virus is able to live on the skin and can infect other persons for up to 2 hours after direct contact.  Also, these can be transmitted when someone coughs or sneezes; thus, it is important to cover the mouth to reduce this risk.  To keep the spread of the illness at bay, good hand hygiene is very important. ? ?This is an infection that is most likely caused by a virus. There are no specific treatments other than to help you with the symptoms until the infection runs its course.  We are sorry you are not feeling well.  Here is how we plan to help! ? ? ?For nasal congestion, you may use an oral decongestants such as Mucinex D or if you have glaucoma or high blood pressure use plain Mucinex.  Saline nasal spray or nasal drops can help and can safely be used as often as  needed for congestion.  ? ?If you do not have a history of heart disease, hypertension, diabetes or thyroid disease, prostate/bladder issues or glaucoma, you may also use Sudafed to treat nasal congestion.  It is highly recommended that you consult with a pharmacist or your primary care physician to ensure this medication is safe for you to take.    ? ?If you have a cough, you may use cough suppressants such as Delsym and Robitussin.  If you have glaucoma or high blood pressure, you can also use Coricidin HBP.   ? ? ?If you have a sore or scratchy throat, use a saltwater gargle- ? to ? teaspoon of salt dissolved in a 4-ounce to 8-ounce glass of warm water.  Gargle the solution for approximately 15-30 seconds and then spit.  It is important not to swallow the solution.  You can also use throat lozenges/cough drops and Chloraseptic spray to help with throat pain or discomfort.  Warm or cold liquids can also be helpful in relieving throat pain. ? ?For headache, pain or general discomfort, you can use Ibuprofen or Tylenol as directed.   ?Some authorities believe that zinc sprays or the use of Echinacea may shorten the course of your symptoms. ? ? ?HOME CARE ?Only take medications as instructed by your medical team. ?Be sure to drink plenty of fluids. Water is fine as well as fruit juices, sodas  and electrolyte beverages. You may want to stay away from caffeine or alcohol. If you are nauseated, try taking small sips of liquids. How do you know if you are getting enough fluid? Your urine should be a pale yellow or almost colorless. ?Get rest. ?Taking a steamy shower or using a humidifier may help nasal congestion and ease sore throat pain. You can place a towel over your head and breathe in the steam from hot water coming from a faucet. ?Using a saline nasal spray works much the same way. ?Cough drops, hard candies and sore throat lozenges may ease your cough. ?Avoid close contacts especially the very young and the  elderly ?Cover your mouth if you cough or sneeze ?Always remember to wash your hands.  ? ?GET HELP RIGHT AWAY IF: ?You develop worsening fever. ?If your symptoms do not improve within 10 days ?You develop yellow or green discharge from your nose over 3 days. ?You have coughing fits ?You develop a severe head ache or visual changes. ?You develop shortness of breath, difficulty breathing or start having chest pain ?Your symptoms persist after you have completed your treatment plan ? ?MAKE SURE YOU  ?Understand these instructions. ?Will watch your condition. ?Will get help right away if you are not doing well or get worse. ? ?Thank you for choosing an e-visit. ? ?Your e-visit answers were reviewed by a board certified advanced clinical practitioner to complete your personal care plan. Depending upon the condition, your plan could have included both over the counter or prescription medications. ? ?Please review your pharmacy choice. Make sure the pharmacy is open so you can pick up prescription now. If there is a problem, you may contact your provider through [employer] and have the prescription routed to another pharmacy.  Your safety is important to us . If you have drug allergies check your prescription carefully.  ? ?For the next 24 hours you can use MyChart to ask questions about today's visit, request a non-urgent call back, or ask for a work or school excuse. ?You will get an email in the next two days asking about your experience. I hope that your e-visit has been valuable and will speed your recovery. ? ? ? ? ?

## 2024-01-31 ENCOUNTER — Encounter
